# Patient Record
Sex: Female | Born: 1950 | Race: White | Hispanic: No | Marital: Married | Smoking: Former smoker
Health system: Southern US, Community
[De-identification: ages and names within clinical notes are randomized; demographics above are authoritative.]

## PROBLEM LIST (undated history)

## (undated) DIAGNOSIS — I480 Paroxysmal atrial fibrillation: Secondary | ICD-10-CM

## (undated) DIAGNOSIS — J40 Bronchitis, not specified as acute or chronic: Secondary | ICD-10-CM

## (undated) DIAGNOSIS — F1020 Alcohol dependence, uncomplicated: Secondary | ICD-10-CM

## (undated) DIAGNOSIS — J449 Chronic obstructive pulmonary disease, unspecified: Secondary | ICD-10-CM

## (undated) HISTORY — PX: NASAL SINUS SURGERY: SHX719

## (undated) HISTORY — PX: APPENDECTOMY: SHX54

## (undated) HISTORY — PX: ABDOMINAL HYSTERECTOMY: SHX81

## (undated) HISTORY — PX: CHOLECYSTECTOMY: SHX55

---

## 2002-02-25 ENCOUNTER — Encounter: Admission: RE | Admit: 2002-02-25 | Discharge: 2002-02-25 | Payer: Self-pay | Admitting: Family Medicine

## 2002-02-25 ENCOUNTER — Encounter: Payer: Self-pay | Admitting: Family Medicine

## 2002-03-12 ENCOUNTER — Other Ambulatory Visit: Admission: RE | Admit: 2002-03-12 | Discharge: 2002-03-12 | Payer: Self-pay | Admitting: Family Medicine

## 2002-03-14 ENCOUNTER — Encounter: Admission: RE | Admit: 2002-03-14 | Discharge: 2002-03-14 | Payer: Self-pay | Admitting: Family Medicine

## 2002-03-14 ENCOUNTER — Encounter: Payer: Self-pay | Admitting: Family Medicine

## 2002-04-17 ENCOUNTER — Encounter: Admission: RE | Admit: 2002-04-17 | Discharge: 2002-04-17 | Payer: Self-pay

## 2002-05-06 ENCOUNTER — Encounter (INDEPENDENT_AMBULATORY_CARE_PROVIDER_SITE_OTHER): Payer: Self-pay | Admitting: *Deleted

## 2002-05-06 ENCOUNTER — Ambulatory Visit (HOSPITAL_COMMUNITY): Admission: RE | Admit: 2002-05-06 | Discharge: 2002-05-06 | Payer: Self-pay

## 2002-08-09 ENCOUNTER — Inpatient Hospital Stay (HOSPITAL_COMMUNITY): Admission: RE | Admit: 2002-08-09 | Discharge: 2002-08-10 | Payer: Self-pay

## 2002-08-09 ENCOUNTER — Encounter (INDEPENDENT_AMBULATORY_CARE_PROVIDER_SITE_OTHER): Payer: Self-pay

## 2003-04-02 ENCOUNTER — Encounter: Payer: Self-pay | Admitting: Family Medicine

## 2003-04-02 ENCOUNTER — Encounter: Admission: RE | Admit: 2003-04-02 | Discharge: 2003-04-02 | Payer: Self-pay | Admitting: Family Medicine

## 2003-04-16 ENCOUNTER — Encounter (INDEPENDENT_AMBULATORY_CARE_PROVIDER_SITE_OTHER): Payer: Self-pay | Admitting: Specialist

## 2003-04-16 ENCOUNTER — Encounter: Payer: Self-pay | Admitting: General Surgery

## 2003-04-17 ENCOUNTER — Encounter: Payer: Self-pay | Admitting: Gastroenterology

## 2003-04-17 ENCOUNTER — Inpatient Hospital Stay (HOSPITAL_COMMUNITY): Admission: RE | Admit: 2003-04-17 | Discharge: 2003-04-18 | Payer: Self-pay | Admitting: General Surgery

## 2005-01-05 ENCOUNTER — Encounter (INDEPENDENT_AMBULATORY_CARE_PROVIDER_SITE_OTHER): Payer: Self-pay | Admitting: *Deleted

## 2005-01-05 ENCOUNTER — Ambulatory Visit (HOSPITAL_COMMUNITY): Admission: RE | Admit: 2005-01-05 | Discharge: 2005-01-05 | Payer: Self-pay | Admitting: Gastroenterology

## 2005-10-05 ENCOUNTER — Encounter: Admission: RE | Admit: 2005-10-05 | Discharge: 2005-10-05 | Payer: Self-pay | Admitting: Family Medicine

## 2009-08-31 ENCOUNTER — Encounter: Admission: RE | Admit: 2009-08-31 | Discharge: 2009-08-31 | Payer: Self-pay | Admitting: Neurology

## 2010-11-26 NOTE — Op Note (Signed)
   NAMECORTNEY, BEISSEL                          ACCOUNT NO.:  [id number]   MEDICAL RECORD NO.:  [id number]                   PATIENT TYPE:  OBV   LOCATION:  0362                                 FACILITY:  [facility]   PHYSICIAN:  John C. Madilyn Fireman, M.D.                 DATE OF BIRTH:  1950/07/12   DATE OF PROCEDURE:  04/17/2003  DATE OF DISCHARGE:                                 OPERATIVE REPORT   PROCEDURE:  Endoscopic retrograde cholangiopancreatography with  sphincterotomy and stone extraction.   INDICATION FOR PROCEDURE:  Common bile duct stone seen on intraoperative  cholangiogram at the time of cholecystectomy yesterday.   DESCRIPTION OF PROCEDURE:  The patient was placed in the prone position and  placed on the pulse monitor with continuous low-flow oxygen delivered by  nasal cannula.  She was sedated with 100 mcg IV fentanyl and 10 mg IV  Versed.  The Olympus video side-viewing endoscope was advanced blindly into  the oropharynx and esophagus and stomach.  The pylorus was traversed and the  papilla of Vater located on the medial duodenal wall.  It had a normal  appearance.  Cannulation was achieved with Wilson-Cook sphincterotome and  the guidewire advanced into the duct.  A cholangiogram was obtained which  confirmed two irregular filling defects, as seen at time of intraoperative  cholangiogram yesterday.  A large sphincterotomy was performed, and both  stones were removed with the 12 mm ballon catheter, but no other filling  defects were seen.  The scope was then withdrawn and the patient returned to  the recovery room in stable condition.  She tolerated the procedure well,  and there were no immediate complications.   IMPRESSION:  Two common bile duct stones, status post sphincterotomy and  stone extraction.   PLAN:  Advance diet and watch for complications.                                               John C. Madilyn Fireman, M.D.    JCH/MEDQ  D:  04/17/2003  T:  04/17/2003   Job:  [id number]   cc:   Lorne Skeens. Hoxworth, M.D.  [address], [address]  [place]  [place] [address]  Fax: [phone]

## 2010-11-26 NOTE — Discharge Summary (Signed)
   Kathy Frost, Kathy Frost                          ACCOUNT NO.:  [id number]   MEDICAL RECORD NO.:  [id number]                   PATIENT TYPE:  INP   LOCATION:  9134                                 FACILITY:  [facility]   PHYSICIAN:  Ronda Fairly. Galen Daft, M.D.              DATE OF BIRTH:  1951-03-20   DATE OF ADMISSION:  08/09/2002  DATE OF DISCHARGE:  08/10/2002                                 DISCHARGE SUMMARY   ADMISSION DIAGNOSES:  1. Menometorrhagia.  2. Uterine fibroids.   PRINCIPAL DIAGNOSES:  1. Menometorrhagia.  2. Uterine fibroids.   PRINCIPAL PROCEDURE:  Total vaginal hysterectomy and bilateral salpingo-  oophorectomy.   COMPLICATIONS:  None.   CONDITION ON DISCHARGE:  Stable.   HOSPITAL COURSE:  The patient was admitted on 08/09/02, for vaginal  hysterectomy and bilateral salpingo-oophorectomy.  She had abnormal uterine  bleeding and pelvic pain.  This patient is a 60 year old woman.  She had  prior treatment which was unsuccessful.  So she had this procedure without  difficulty.  Dr. Ashley Royalty was the assisting surgeon, and she was doing well  in the postoperative period with the exception of some nausea.  She had a  discontinuation of the morphine which resulted in an excellent response to  her nausea complaint.  On the first postoperative day, she was doing very  well, she was clinically stable, following up with a regular diet, and no  problems.  Her activity limits, wound care, follow up in the office, and  medications were discussed with the patient.  She was discharged home  without difficulty on 08/10/02, able to void well without difficulty.  No  problems with her bowel habits.  Her examination was unremarkable.  No CVA  tenderness or fever.   FINAL DIAGNOSES:  1. Uterine fibroids, symptomatic.  2. Simple hyperplasia without atypia.  The pathology came back with normal cervix, no abnormality.  Both tubes and  ovaries benign.  Paratubal cyst, and benign ovaries without  pathologic  abnormality.                                               Ronda Fairly. Galen Daft, M.D.    NJT/MEDQ  D:  09/09/2002  T:  09/09/2002  Job:  [id number]

## 2010-11-26 NOTE — Op Note (Signed)
NAMEJOSELIN, CRANDELL                          ACCOUNT NO.:  [id number]   MEDICAL RECORD NO.:  [id number]                   PATIENT TYPE:  AMB   LOCATION:  DAY                                  FACILITY:  [facility]   PHYSICIAN:  Sharlet Salina T. Hoxworth, M.D.          DATE OF BIRTH:  March 06, 1951   DATE OF PROCEDURE:  04/16/2003  DATE OF DISCHARGE:                                 OPERATIVE REPORT   PREOPERATIVE DIAGNOSIS:  Cholelithiasis and cholecystitis.   POSTOPERATIVE DIAGNOSIS:  Cholelithiasis and cholecystitis.  Choledocholithiasis.   PROCEDURE:  Laparoscopic cholecystectomy with intraoperative cholangiogram.   SURGEON:  Sharlet Salina T. Hoxworth, M.D.   ANESTHESIA:  General.   BRIEF HISTORY:  Ms. Cowper is a 60 year old white female who presents with a  number of months of worsening episodic right upper quadrant abdominal pain  which now has become almost daily and severe. She has had a gallbladder  ultrasound showing multiple gallstones with a thickened gallbladder wall and  normal common bile duct. LFTs were normal. Laparoscopic cholecystectomy with  cholangiogram has been recommended and accepted. The nature of the  procedure, indications, risks of bleeding, infection, bile leak and bile  duct injury were discussed and understood. She is now brought to the  operating room for this procedure.   DESCRIPTION OF PROCEDURE:  The patient was brought to the operating room,  placed in supine position on the operating table and general endotracheal  anesthesia was induced. She received preoperative antibiotics. PAS were in  place. The abdomen was sterilely prepped and draped. The abdomen was  accessed with an open Hasson technique at the umbilicus and standard 4  trocars used. The gallbladder was visualized and was quite thickened  subacutely and chronically inflamed. Omental adhesions were taken down off  the gallbladder and the fundus was grasped and elevated up over the liver.  The  infundibulum was retracted inferolaterally. The gallbladder was  thickened right down to the cystic duct area. Peritoneum anterior and  posterior to Calot's triangle was incised and fibrofatty tissues carefully  stripped down off the neck of the gallbladder toward the porta hepatis. The  cystic duct was identified which appeared somewhat large. The cystic duct  gallbladder junction was dissected 360 degrees and the cystic artery clearly  identified in Calot's triangle. At this point, the cystic duct was clipped  at the gallbladder junction, operative cholangiograms obtained through the  cystic duct. This showed a normal caliber common bile duct and intrahepatic  ducts with flow into the duodenum but two definite mobile filling defects  consistent with stones in the distal common bile duct. I felt that there was  really too much inflammatory change around the cystic duct to attempt  laparoscopic stone extraction. I elected to leave these for ERCP and  extraction. The cholangiocath was removed and the cystic duct was tied with  a #0 Vicryl endoloop and additionally clipped. The cystic artery, anterior  and posterior branches were divided between the proximal and distal clips.  The gallbladder was removed from the bed with cautery and removed through  the umbilicus. The right upper quadrant was thoroughly irrigated and  hemostasis ensured. The trocars were removed and all CO2 evacuated. The  mattress suture of Vicryl was secured at the  umbilicus. The skin incisions were closed with subcuticular 4-0 Monocryl and  Steri-Strips. Sponge, needle and instrument counts were correct. Dry sterile  dressings were applied and the patient taken to recovery in good condition.                                               Lorne Skeens. Hoxworth, M.D.    Tory Emerald  D:  04/16/2003  T:  04/16/2003  Job:  [id number]

## 2010-11-26 NOTE — Op Note (Signed)
Kathy Frost, Kathy Frost                          ACCOUNT NO.:  [id number]   MEDICAL RECORD NO.:  [id number]                   PATIENT TYPE:  INP   LOCATION:  9324                                 FACILITY:  [facility]   PHYSICIAN:  Ronda Fairly. Galen Daft, M.D.              DATE OF BIRTH:  [birth year]   DATE OF PROCEDURE:  08/09/2002  DATE OF DISCHARGE:                                 OPERATIVE REPORT   PREOPERATIVE DIAGNOSES:  1. Menometrorrhagia.  2. Uterine fibroids.  3. Complex hyperplasia on D&C specimen.   POSTOPERATIVE DIAGNOSES:  1. Menometrorrhagia.  2. Uterine fibroids.  3. Complex hyperplasia on D&C specimen.   PROCEDURE:  1. Total vaginal hysterectomy.  2. Bilateral salpingo-oophorectomy.   SURGEON:  Ronda Fairly. Galen Daft, M.D.   ASSISTANT:  Rudy Jew. Ashley Royalty, M.D.   COMPLICATIONS:  None.   ESTIMATED BLOOD LOSS:  300 mL.   SPECIMENS:  Uterus, cervix, tubes, and ovaries bilaterally and uterine  fibroids.   ANESTHESIA:  General.   OPERATIVE COURSE:  The patient was identified as Kathy Frost during time  out.  Prior to this we did informed consent in the holding area including  the risks of surgery, hemorrhage, infection, injury to internal organs, and  recovery and pain.  The patient wished to have BSO if the ovaries were  accessible.  The patient had reviewed the consent with me, had signed it,  and was brought to the operating room.  Again, time out was performed.  Betadine prep sterile technique and general anesthesia.  Bladder was  catheterized at the initial part of the procedure in-and-out  catheterization.  The cervix was infiltrated with lidocaine 1% with  1:100,000 epinephrine for a medical tourniquet.  This was a total of 18 mL  injected.  Care was taken to avoid intravascular injection.  The posterior  cul-de-sac was identified and the area was free of adhesions.  This was  entered without difficulty using Mayo scissors.  The retractors were placed  in the  appropriate locations.  The uterosacral ligaments were clamped,  divided, and ligated with 0 Vicryl suture.  The anterior cul-de-sac was  entered and care was taken to avoid injury to the bladder and adjacent  structures.  The uterosacral ligaments having been previously captured.  Now  the cardinal ligaments including the uterine artery were captured on either  side with 0 Vicryl suture ligature.  Successive bites of the cardinal  ligament and finally the utero-ovarian ligament were carried out without  difficulty.  One uterine fibroid separated free from the uterus during the  extraction.  This was sent together with the specimen.  The ovaries appeared  normal; however, they were readily accessible and as I had discussed with  the patient prior we decided to remove the ovaries with the tubes.  The  utero-ovarian ligaments had been grasped and these then were grasped at the  infundibulopelvic ligament and  the ovaries and the tubes were removed on  each side.  The left side was removed with ovary and tube.  The right side  was removed with tube separate from ovary.  The right side was completely  hemostatic.  On the left side there was a blood vessel which was bleeding.  This was grasped with a right angle clamp and identified separate and  distinct from vital structures and ligated with 2-0 Vicryl.  This led to  complete hemostasis.  During the capture of this blood vessel there was  approximately 150 mL of additional blood loss.  The total estimated blood  loss was 300 mL for the case.  The patient remained stable throughout the  case.  The areas were inspected for hemostasis.  Everything was completely  hemostatic.  All instrument, sponge, and needle counts were correct and the  vagina was closed in a running locking fashion using the uterosacral  ligament complex for reinforcement and for support of the vaginal wall.  The  patient left the operating room in stable condition.  All  instrument,  sponge, and needle counts were correct at the end of the case.  Total  estimated blood loss was 300 mL.                                               Ronda Fairly. Galen Daft, M.D.    NJT/MEDQ  D:  08/09/2002  T:  08/09/2002  Job:  [id number]   cc:   Donia Guiles, M.D.  [address] [place]  [place] [address]  Fax: [phone]

## 2010-11-26 NOTE — Op Note (Signed)
   Kathy Frost, Kathy Frost                          ACCOUNT NO.:  [id number]   MEDICAL RECORD NO.:  [id number]                   PATIENT TYPE:  AMB   LOCATION:  SDC                                  FACILITY:  [facility]   PHYSICIAN:  Ronda Fairly. Galen Daft, M.D.              DATE OF BIRTH:  [birth year]   DATE OF PROCEDURE:  05/06/2002  DATE OF DISCHARGE:                                 OPERATIVE REPORT   PREOPERATIVE DIAGNOSIS:  Menometrorrhagia and uterine fibroids.   POSTOPERATIVE DIAGNOSES:  1. Menometorrhagia and uterine fibroids.  2. Endometrial polyps.   PROCEDURE:  Hysteroscopy with resection of endometrial polyps and curettage.   SURGEON:  Ronda Fairly. Galen Daft, M.D.   COMPLICATIONS:  None.   CONDITION:  Stable.   SPECIMENS:  Uterine polyps and uterine curettage.   ESTIMATED BLOOD LOSS:  10 cc or less.   ANESTHESIA:  Monitored anesthesia care with local 13 cc of 1% lidocaine.   DESCRIPTION OF PROCEDURE:  The patient was identified.  Informed consent was  obtained.  Time out was performed, and the bladder was catheterized.  The  bladder was catheterized and emptied.  Betadine prep sterile technique.  The  uterus was slightly enlarged by examination, retroverted and difficult to  palpate its full extent.  No adnexal masses.  The cervix was dilated to  accept a hysteroscope.  Placing the sound in prior to this revealed the  uterine cavity to be 9 cm in length.  The hysteroscope revealed evidence of  uterine polyps in the endometrial canal.  Polyp forceps were utilized and we  were able to remove all of the polyps.  These were sent separately from the  uterine curettage.  There was, in addition, some fluffy endometrium, which  was removed with sharp curet.  There were no complications.  The total  procedure was approximately 100 cc fluid deficit without difficulty.  The  patient left the operating room in stable condition.  All instrument, sponge  and needle counts were correct.                                           Ronda Fairly. Galen Daft, M.D.    NJT/MEDQ  D:  05/06/2002  T:  05/06/2002  Job:  [id number]

## 2010-11-26 NOTE — Consult Note (Signed)
Kathy Frost, Kathy Frost                          ACCOUNT NO.:  [id number]   MEDICAL RECORD NO.:  [id number]                   PATIENT TYPE:  OBV   LOCATION:  0362                                 FACILITY:  [facility]   PHYSICIAN:  Kathy Frost, M.D.                DATE OF BIRTH:  03-Apr-1951   DATE OF CONSULTATION:  04/16/2003  DATE OF DISCHARGE:                                   CONSULTATION   REASON FOR ADMISSION:  Dr. Johna Frost asked [place] to see this 60 year old female  because of apparent common duct stones on intraoperative cholangiography  performed today.   HISTORY:  Kathy Frost underwent a laparoscopic cholecystectomy today for a  history of cholecystitis with a severely inflamed gallbladder found at  surgery, and cholangiography showed two 3-4 mm filling defects, which were  persistent and appear real even though there was no significant ductal  dilatation, and there was adequate flow of contrast into the duodenum.  Preoperative liver chemistries and white count were completely normal.   Based on these findings, however, GI evaluation was felt to be warranted.   The patient is known to my partner, Dr. Vida Frost, who was going to perform  screening colonoscopy on the patient some time ago, but due to an unexpected  work conflict, she had to cancel that exam, and has never followed through  with it.  I advised her to contact Dr. Ewing Frost at her convenience to have this  attended to.   PAST MEDICAL HISTORY:   ALLERGIES:  CODEINE (GI upset.   OUTPATIENT MEDICATIONS:  None.   OPERATIONS:  1. Appendectomy as a child.  2. Hysterectomy in January of 2004.  3. Laparoscopic cholecystectomy today.   MEDICAL ILLNESSES:  No cardiopulmonary disease, diabetes, or hypertension.   HABITS:  Occasional ethanol.  One pack per day smoking history.   FAMILY HISTORY:  Negative for GI tract illnesses including gallbladder  disease, colon cancer, liver disease, and ulcers.   SOCIAL HISTORY:   Married.  Accompanied by her husband at the hospital room.   REVIEW OF SYSTEMS:  Not obtained.   PHYSICAL EXAMINATION:  GENERAL:  A pleasant, healthy-appearing female in no  evident distress.  Anicteric.  CHEST:  Clear.  HEART:  Normal.  ABDOMEN:  At this time, the abdomen is remarkably nontender.  She has  bandages over her laparoscopic incisions.   LABORATORY DATA:  Liver chemistries normal.  White count normal.  Hemoglobin  15.6.   Intraoperative cholangiogram reviewed.  Agree with Kathy Frost  interpretation that there are two probable stones present.   DISCUSSION AND PLAN:  Most of this evening's time with the patient and her  husband was spent going over principles of choledocholithiasis, the nature  history of that condition, and options for management in this setting, which  would include expectant observation, MRCP to try to verify the presence of  the stones, or proceeding  directly to ERCP with probable sphincterotomy.  The risks of ERCP, sphincterotomy, and stone extraction were discussed, and  included pancreatitis, roughly 5%, with occasional mortality, and serious  complications, perhaps 1% of the time.  The patient seems to understand well  and is agreeable to proceed.   The patient is aware that due to scheduling conflicts, I will not be  available for her exam tomorrow, and that it will probably be my partner,  Kathy Frost, performing the exam, and she is agreeable to this.                                               Kathy Frost, M.D.    RB/MEDQ  D:  04/16/2003  T:  04/16/2003  Job:  [id number]   cc:   Kathy Frost. Hoxworth, M.D.  [address], [address]  [place]  [place] [address]  Fax: [phone]   Izora Ribas(?) Hetty Ely(?), M.D.

## 2010-11-26 NOTE — Op Note (Signed)
NAMELATALIA, ETZLER NO.:  [id number]   MEDICAL RECORD NO.:  [id number]          PATIENT TYPE:  AMB   LOCATION:  ENDO                         [facility]ACILITY[facility]   PHYSICIAN:  Petra Kuba, M.D.    DATE OF BIRTH:  25-May-1951   DATE OF PROCEDURE:  01/05/2005  DATE OF DISCHARGE:                                 OPERATIVE REPORT   PROCEDURE:  Colonoscopy with polypectomy.   INDICATION:  A family history of colon polyps, due for colonic screening.  Consent was signed after risks, benefits, methods, options were thoroughly  discussed in the office.   MEDICATIONS USED:  Demerol 70, Versed 6.   DESCRIPTION OF PROCEDURE:  Rectal inspection was pertinent for external  hemorrhoids.  Small digital exam was negative.  The video pediatric  adjustable colonoscope was inserted with some difficulty due to a tortuous  sigmoid which required abdominal pressure.  Once through this area, I was  fairly easily able to advance around the colon to the cecum, which was  identified by the appendiceal orifice and ileocecal valve.  No abnormalities  were seen on insertion.  No position changes were needed.  The cecum was  identified by the appendiceal orifice and ileocecal valve.  The scope was  slowly withdrawn.  The prep was adequate.  There was some liquid stool that  required washing and suctioning.  The cecum, ascending, transverse,  descending, and majority of the sigmoid were normal.  In the distal sigmoid,  two small sessile polyps were seen.  Both were snared and electrocautery  applied, suctioned through the scope, and collected in the trap.  There were  about six tiny hyperplastic appearing polyps in both the distal sigmoid and  rectum which were hot biopsied as well and put in the same container.  Anal  rectal pull-through and retroflexion confirmed some small hemorrhoids.  The  scope was straightened and re-advanced a short ways up the left side of the  colon.  Air was  suctioned and the scope removed.  The patient tolerated the  procedure well.  There was no obvious immediate complication.   ENDOSCOPIC DIAGNOSES:  1.  Internal/external hemorrhoids.  2.  Tortuous sigmoid.  3.  Multiple tiny hyperplastic-appearing rectal distal sigmoid polyps, hot      biopsied.  4.  Two small sessile polyps snared in the distal sigmoid.  5.  Otherwise within normal limits to the cecum.   PLAN:  Await pathology to determine future colonic screening.  Happy to see  back p.r.n.  Otherwise return to care of Dr. Arvilla Market for the customary  healthcare maintenance to include yearly rectals and guaiacs.       MEM/MEDQ  D:  01/05/2005  T:  01/05/2005  Job:  [id number]   cc:   Donia Guiles, M.D.  [address] [place]  [place] [address]  Fax: [phone]

## 2012-12-03 ENCOUNTER — Ambulatory Visit: Payer: BC Managed Care – PPO

## 2012-12-03 ENCOUNTER — Ambulatory Visit (INDEPENDENT_AMBULATORY_CARE_PROVIDER_SITE_OTHER): Payer: BC Managed Care – PPO | Admitting: Emergency Medicine

## 2012-12-03 VITALS — BP 138/90 | HR 92 | Temp 98.1°F | Resp 16 | Ht 67.0 in | Wt 148.0 lb

## 2012-12-03 DIAGNOSIS — M79642 Pain in left hand: Secondary | ICD-10-CM

## 2012-12-03 DIAGNOSIS — M79609 Pain in unspecified limb: Secondary | ICD-10-CM

## 2012-12-03 DIAGNOSIS — S62309A Unspecified fracture of unspecified metacarpal bone, initial encounter for closed fracture: Secondary | ICD-10-CM

## 2012-12-03 NOTE — Progress Notes (Signed)
   Patient ID: MELEA PREZIOSO MRN: [id number], DOB: 09-18-1950, 62 y.o. Date of Encounter: 12/03/2012, 11:34 AM   PROCEDURE NOTE: Verbal consent obtained.  Ulnar gutter splint applied to the left hand. Wrapped with ACE wrap per usual protocol.  Cap refill less than 2 seconds through all digits. Patient tolerated well.   Signed, Eula Listen, PA-C 12/03/2012 11:34 AM

## 2012-12-03 NOTE — Patient Instructions (Addendum)
Hand Fracture Your caregiver has diagnosed you with a fractured (broken) bone in your hand. If the bones are in good position and the hand is properly immobilized and rested, these injuries will usually heal in 3 to 6 weeks. A cast, splint, or bulky bandage is usually applied to keep the fracture site from moving. Do not remove the splint or cast until your caregiver approves. If the fracture is unstable or the bones are not aligned properly, surgery may be needed. Keep your hand raised (elevated) above the level of your heart as much as possible for the next 2 to 3 days until the swelling and pain are better. Apply ice packs for 15-20 minutes every 3 to 4 hours to help control the pain and swelling. See your caregiver or an orthopedic specialist as directed for follow-up care to make sure the fracture is beginning to heal properly. SEEK IMMEDIATE MEDICAL CARE IF:   You notice your fingers are cold, numb, crooked, or the pain of your injury is severe.  You are not improving or seem to be getting worse.  You have questions or concerns. Document Released: 08/04/2004 Document Revised: 09/19/2011 Document Reviewed: 12/23/2008 Exit[employer] Patient Information 2014 [employer].  

## 2012-12-03 NOTE — Progress Notes (Signed)
[facility] [address] [place] [address] [phone]  Date:  12/03/2012   Name:  Kathy Frost   DOB:  September 03, 1950   MRN:  [id number]  PCP:  No primary provider on file.    Chief Complaint: Hand Pain   History of Present Illness:  Kathy Frost is a 62 y.o. very pleasant female patient who presents with the following:  Missed a step on a ladder and fell to the ground, injuring her left hand on Saturday.  Has increased pain and swelling and no deformity.  Pain increases with use.  No improvement with over the counter medications or other home remedies. Denies other complaint of injury or health concern today.   There are no active problems to display for this patient.   History reviewed. No pertinent past medical history.  History reviewed. No pertinent past surgical history.  History  Substance Use Topics  . Smoking status: Current Every Day Smoker  . Smokeless tobacco: Not on file  . Alcohol Use: Yes    History reviewed. No pertinent family history.  No Known Allergies  Medication list has been reviewed and updated.  No current outpatient prescriptions on file prior to visit.   No current facility-administered medications on file prior to visit.    Review of Systems:  As per HPI, otherwise negative.    Physical Examination: Filed Vitals:   12/03/12 1015  BP: 138/90  Pulse: 92  Temp: 98.1 F (36.7 C)  Resp: 16   Filed Vitals:   12/03/12 1015  Height: 5\' 7"  (1.702 m)  Weight: 148 lb (67.132 kg)   Body mass index is 23.17 kg/(m^2). Ideal Body Weight: Weight in (lb) to have BMI = 25: 159.3   GEN: WDWN, NAD, Non-toxic, Alert & Oriented x 3 HEENT: Atraumatic, Normocephalic.  Ears and Nose: No external deformity. EXTR: No clubbing/cyanosis/edema NEURO: Normal gait.  PSYCH: Normally interactive. Conversant. Not depressed or anxious appearing.  Calm demeanor.  Left Hand:  Tender and ecchymotic left hand over 4-5  metatarsals.  No deformity.  Moderate edema.   Assessment and Plan: Fracture fifth metacarpal Ulnar gutter splint Ortho referral   Signed,  Phillips Odor, MD

## 2012-12-04 ENCOUNTER — Telehealth: Payer: Self-pay

## 2012-12-04 NOTE — Telephone Encounter (Signed)
Calling about referral to orthopaedic surgeon. States she has left several messages with [place] about this. [phone]

## 2012-12-05 NOTE — Telephone Encounter (Signed)
Patient needs x-ray for an appointment with [facility] at 8 am.    [phone]

## 2013-04-21 ENCOUNTER — Inpatient Hospital Stay (HOSPITAL_COMMUNITY)
Admission: EM | Admit: 2013-04-21 | Discharge: 2013-04-24 | DRG: 087 | Disposition: A | Discharge status: Home / Self Care | Payer: BC Managed Care – PPO | Attending: Internal Medicine | Admitting: Internal Medicine

## 2013-04-21 ENCOUNTER — Encounter (HOSPITAL_COMMUNITY): Payer: Self-pay | Admitting: Emergency Medicine

## 2013-04-21 ENCOUNTER — Emergency Department (HOSPITAL_COMMUNITY): Payer: BC Managed Care – PPO

## 2013-04-21 DIAGNOSIS — Z801 Family history of malignant neoplasm of trachea, bronchus and lung: Secondary | ICD-10-CM

## 2013-04-21 DIAGNOSIS — J440 Chronic obstructive pulmonary disease with acute lower respiratory infection: Secondary | ICD-10-CM | POA: Diagnosis present

## 2013-04-21 DIAGNOSIS — J9601 Acute respiratory failure with hypoxia: Secondary | ICD-10-CM | POA: Diagnosis present

## 2013-04-21 DIAGNOSIS — F17200 Nicotine dependence, unspecified, uncomplicated: Secondary | ICD-10-CM | POA: Diagnosis present

## 2013-04-21 DIAGNOSIS — R21 Rash and other nonspecific skin eruption: Secondary | ICD-10-CM | POA: Diagnosis present

## 2013-04-21 DIAGNOSIS — J209 Acute bronchitis, unspecified: Secondary | ICD-10-CM | POA: Diagnosis present

## 2013-04-21 DIAGNOSIS — J9600 Acute respiratory failure, unspecified whether with hypoxia or hypercapnia: Principal | ICD-10-CM

## 2013-04-21 DIAGNOSIS — R7989 Other specified abnormal findings of blood chemistry: Secondary | ICD-10-CM

## 2013-04-21 DIAGNOSIS — Z720 Tobacco use: Secondary | ICD-10-CM

## 2013-04-21 DIAGNOSIS — Z9089 Acquired absence of other organs: Secondary | ICD-10-CM

## 2013-04-21 DIAGNOSIS — J449 Chronic obstructive pulmonary disease, unspecified: Secondary | ICD-10-CM

## 2013-04-21 DIAGNOSIS — Z79899 Other long term (current) drug therapy: Secondary | ICD-10-CM

## 2013-04-21 DIAGNOSIS — R0902 Hypoxemia: Secondary | ICD-10-CM

## 2013-04-21 DIAGNOSIS — IMO0002 Reserved for concepts with insufficient information to code with codable children: Secondary | ICD-10-CM

## 2013-04-21 DIAGNOSIS — Z23 Encounter for immunization: Secondary | ICD-10-CM

## 2013-04-21 DIAGNOSIS — F1010 Alcohol abuse, uncomplicated: Secondary | ICD-10-CM | POA: Diagnosis present

## 2013-04-21 HISTORY — DX: Bronchitis, not specified as acute or chronic: J40

## 2013-04-21 HISTORY — DX: Chronic obstructive pulmonary disease, unspecified: J44.9

## 2013-04-21 LAB — BASIC METABOLIC PANEL
Calcium: 9.8 mg/dL (ref 8.4–10.5)
Creatinine, Ser: 0.6 mg/dL (ref 0.50–1.10)
Glucose, Bld: 116 mg/dL — ABNORMAL HIGH (ref 70–99)
Potassium: 4.4 mEq/L (ref 3.5–5.1)

## 2013-04-21 LAB — CBC
MCH: 33.1 pg (ref 26.0–34.0)
MCHC: 35.7 g/dL (ref 30.0–36.0)
MCV: 92.9 fL (ref 78.0–100.0)
Platelets: 482 10*3/uL — ABNORMAL HIGH (ref 150–400)
WBC: 12 10*3/uL — ABNORMAL HIGH (ref 4.0–10.5)

## 2013-04-21 LAB — POCT I-STAT 3, ART BLOOD GAS (G3+)
Acid-base deficit: 1 mmol/L (ref 0.0–2.0)
Bicarbonate: 20.2 mEq/L (ref 20.0–24.0)
O2 Saturation: 98 %
TCO2: 21 mmol/L (ref 0–100)
pH, Arterial: 7.496 — ABNORMAL HIGH (ref 7.350–7.450)
pO2, Arterial: 89 mmHg (ref 80.0–100.0)

## 2013-04-21 LAB — POCT I-STAT TROPONIN I: Troponin i, poc: 0 ng/mL (ref 0.00–0.08)

## 2013-04-21 LAB — PRO B NATRIURETIC PEPTIDE: Pro B Natriuretic peptide (BNP): 375.9 pg/mL — ABNORMAL HIGH (ref 0–125)

## 2013-04-21 LAB — D-DIMER, QUANTITATIVE: D-Dimer, Quant: <0.27 ug/mL-FEU (ref 0.00–0.48)

## 2013-04-21 MED ORDER — PANTOPRAZOLE SODIUM 40 MG PO TBEC
40.0000 mg | DELAYED_RELEASE_TABLET | Freq: Every day | ORAL | Status: DC
  Administered 2013-04-22 – 2013-04-24 (×3): 40 mg via ORAL
  Filled 2013-04-21 (×2): qty 1

## 2013-04-21 MED ORDER — BUDESONIDE 0.25 MG/2ML IN SUSP
0.2500 mg | Freq: Two times a day (BID) | RESPIRATORY_TRACT | Status: DC
  Administered 2013-04-21 – 2013-04-23 (×5): 0.25 mg via RESPIRATORY_TRACT
  Filled 2013-04-21 (×8): qty 2

## 2013-04-21 MED ORDER — ACETAMINOPHEN 650 MG RE SUPP: 650.0000 mg | Freq: Four times a day (QID) | RECTAL | Status: DC | PRN

## 2013-04-21 MED ORDER — ONDANSETRON HCL 4 MG PO TABS: 4.0000 mg | ORAL_TABLET | Freq: Four times a day (QID) | ORAL | Status: DC | PRN

## 2013-04-21 MED ORDER — ALBUTEROL SULFATE (5 MG/ML) 0.5% IN NEBU
2.5000 mg | INHALATION_SOLUTION | Freq: Four times a day (QID) | RESPIRATORY_TRACT | Status: DC
  Administered 2013-04-21 – 2013-04-22 (×2): 2.5 mg via RESPIRATORY_TRACT
  Filled 2013-04-21 (×2): qty 0.5

## 2013-04-21 MED ORDER — ACETAMINOPHEN 325 MG PO TABS: 650.0000 mg | ORAL_TABLET | Freq: Four times a day (QID) | ORAL | Status: DC | PRN

## 2013-04-21 MED ORDER — ENOXAPARIN SODIUM 40 MG/0.4ML ~~LOC~~ SOLN
40.0000 mg | SUBCUTANEOUS | Status: DC
  Administered 2013-04-21 – 2013-04-23 (×3): 40 mg via SUBCUTANEOUS
  Filled 2013-04-21 (×4): qty 0.4

## 2013-04-21 MED ORDER — ALBUTEROL SULFATE (5 MG/ML) 0.5% IN NEBU
2.5000 mg | INHALATION_SOLUTION | RESPIRATORY_TRACT | Status: DC
  Administered 2013-04-21: 2.5 mg via RESPIRATORY_TRACT
  Filled 2013-04-21: qty 0.5

## 2013-04-21 MED ORDER — PREDNISONE 20 MG PO TABS
60.0000 mg | ORAL_TABLET | Freq: Once | ORAL | Status: AC
  Administered 2013-04-21: 60 mg via ORAL
  Filled 2013-04-21: qty 3

## 2013-04-21 MED ORDER — ONDANSETRON HCL 4 MG/2ML IJ SOLN: 4.0000 mg | Freq: Four times a day (QID) | INTRAMUSCULAR | Status: DC | PRN

## 2013-04-21 MED ORDER — ADULT MULTIVITAMIN W/MINERALS CH
1.0000 | ORAL_TABLET | Freq: Every day | ORAL | Status: DC
  Administered 2013-04-21 – 2013-04-24 (×4): 1 via ORAL
  Filled 2013-04-21 (×4): qty 1

## 2013-04-21 MED ORDER — VITAMIN B-1 100 MG PO TABS
100.0000 mg | ORAL_TABLET | Freq: Every day | ORAL | Status: DC
  Administered 2013-04-21 – 2013-04-24 (×4): 100 mg via ORAL
  Filled 2013-04-21 (×4): qty 1

## 2013-04-21 MED ORDER — SODIUM CHLORIDE 0.9 % IJ SOLN
3.0000 mL | Freq: Two times a day (BID) | INTRAMUSCULAR | Status: DC
  Administered 2013-04-21 – 2013-04-23 (×5): 3 mL via INTRAVENOUS

## 2013-04-21 MED ORDER — PNEUMOCOCCAL VAC POLYVALENT 25 MCG/0.5ML IJ INJ
0.5000 mL | INJECTION | INTRAMUSCULAR | Status: AC
  Administered 2013-04-22: 0.5 mL via INTRAMUSCULAR
  Filled 2013-04-21: qty 0.5

## 2013-04-21 MED ORDER — IPRATROPIUM BROMIDE 0.02 % IN SOLN: 0.5000 mg | RESPIRATORY_TRACT | Status: DC | PRN

## 2013-04-21 MED ORDER — LORAZEPAM 2 MG/ML IJ SOLN: 1.0000 mg | Freq: Four times a day (QID) | INTRAMUSCULAR | Status: DC | PRN

## 2013-04-21 MED ORDER — ONDANSETRON HCL 4 MG/2ML IJ SOLN: 4.0000 mg | Freq: Three times a day (TID) | INTRAMUSCULAR | Status: DC | PRN

## 2013-04-21 MED ORDER — ALBUTEROL (5 MG/ML) CONTINUOUS INHALATION SOLN
10.0000 mg/h | INHALATION_SOLUTION | Freq: Once | RESPIRATORY_TRACT | Status: AC
  Administered 2013-04-21: 10 mg/h via RESPIRATORY_TRACT

## 2013-04-21 MED ORDER — METHYLPREDNISOLONE SODIUM SUCC 40 MG IJ SOLR
40.0000 mg | Freq: Two times a day (BID) | INTRAMUSCULAR | Status: DC
  Administered 2013-04-21 – 2013-04-23 (×4): 40 mg via INTRAVENOUS
  Filled 2013-04-21 (×5): qty 1

## 2013-04-21 MED ORDER — LORAZEPAM 2 MG/ML IJ SOLN
0.0000 mg | Freq: Four times a day (QID) | INTRAMUSCULAR | Status: DC
  Administered 2013-04-22 (×2): 2 mg via INTRAVENOUS
  Filled 2013-04-21 (×2): qty 1

## 2013-04-21 MED ORDER — LORAZEPAM 1 MG PO TABS: 1.0000 mg | ORAL_TABLET | Freq: Four times a day (QID) | ORAL | Status: DC | PRN

## 2013-04-21 MED ORDER — ALBUTEROL SULFATE (5 MG/ML) 0.5% IN NEBU: 2.5000 mg | INHALATION_SOLUTION | RESPIRATORY_TRACT | Status: DC | PRN

## 2013-04-21 MED ORDER — INFLUENZA VAC SPLIT QUAD 0.5 ML IM SUSP
0.5000 mL | INTRAMUSCULAR | Status: AC
  Administered 2013-04-22: 0.5 mL via INTRAMUSCULAR
  Filled 2013-04-21: qty 0.5

## 2013-04-21 MED ORDER — LORAZEPAM 2 MG/ML IJ SOLN: 0.0000 mg | Freq: Two times a day (BID) | INTRAMUSCULAR | Status: DC

## 2013-04-21 MED ORDER — IPRATROPIUM BROMIDE 0.02 % IN SOLN
0.5000 mg | Freq: Four times a day (QID) | RESPIRATORY_TRACT | Status: DC
  Administered 2013-04-21 – 2013-04-22 (×2): 0.5 mg via RESPIRATORY_TRACT
  Filled 2013-04-21 (×2): qty 2.5

## 2013-04-21 MED ORDER — IPRATROPIUM BROMIDE 0.02 % IN SOLN: 0.5000 mg | RESPIRATORY_TRACT | Status: DC

## 2013-04-21 MED ORDER — SODIUM CHLORIDE 0.9 % IJ SOLN
3.0000 mL | Freq: Two times a day (BID) | INTRAMUSCULAR | Status: DC
  Administered 2013-04-22 – 2013-04-23 (×2): 3 mL via INTRAVENOUS

## 2013-04-21 MED ORDER — FOLIC ACID 1 MG PO TABS
1.0000 mg | ORAL_TABLET | Freq: Every day | ORAL | Status: DC
  Administered 2013-04-21 – 2013-04-24 (×4): 1 mg via ORAL
  Filled 2013-04-21 (×4): qty 1

## 2013-04-21 MED ORDER — LEVOFLOXACIN IN D5W 750 MG/150ML IV SOLN
750.0000 mg | INTRAVENOUS | Status: DC
  Administered 2013-04-21 – 2013-04-22 (×2): 750 mg via INTRAVENOUS
  Filled 2013-04-21 (×3): qty 150

## 2013-04-21 MED ORDER — ASPIRIN 81 MG PO CHEW
324.0000 mg | CHEWABLE_TABLET | Freq: Once | ORAL | Status: AC
  Administered 2013-04-21: 324 mg via ORAL
  Filled 2013-04-21: qty 4

## 2013-04-21 MED ORDER — THIAMINE HCL 100 MG/ML IJ SOLN
100.0000 mg | Freq: Every day | INTRAMUSCULAR | Status: DC
  Filled 2013-04-21 (×3): qty 1

## 2013-04-21 MED ORDER — ALBUTEROL SULFATE (5 MG/ML) 0.5% IN NEBU: 2.5000 mg | INHALATION_SOLUTION | RESPIRATORY_TRACT | Status: DC

## 2013-04-21 NOTE — ED Notes (Signed)
Patient transported to X-ray 

## 2013-04-21 NOTE — ED Notes (Signed)
Pt reports recent bronchitis and now has sob and chest heaviness, feels like someone is sitting on her chest. spo2 92% at triage, ekg being done. Having non productive cough.

## 2013-04-21 NOTE — ED Provider Notes (Signed)
TIME SEEN: 4:56 PM  CHIEF COMPLAINT: Shortness of breath, chest tightness  HPI: Patient is a 62 -year-old female with a history of tobacco use who presents the emergency department with complaints of shortness of breath for the past week and chest tightness that started yesterday. She states her symptoms are worse with exertion. She does not wear oxygen at home. She has had a dry cough. No fever. No lower extremity swelling or pain. No history of PE or DVT. No recent prolonged immobilization, international flight, exogenous hormone use, surgery, trauma or fracture. She states a week and a half ago she finished a course of azithromycin for bronchitis. She denies a history of COPD or asthma. She denies a history of hypertension, diabetes, hyperlipidemia or family history of premature CAD. Denies a prior history of stress test or cardiac catheterization. Her PCP is at [facility].  ROS: See HPI Constitutional: no fever  Eyes: no drainage  ENT: no runny nose   Cardiovascular:   chest pain  Resp:  SOB  GI: no vomiting GU: no dysuria Integumentary: no rash  Allergy: no hives  Musculoskeletal: no leg swelling  Neurological: no slurred speech ROS otherwise negative  PAST MEDICAL HISTORY/PAST SURGICAL HISTORY:  Past Medical History  Diagnosis Date  . Bronchitis     MEDICATIONS:  Prior to Admission medications   Not on File    ALLERGIES:  No Known Allergies  SOCIAL HISTORY:  History  Substance Use Topics  . Smoking status: Current Every Day Smoker  . Smokeless tobacco: Not on file  . Alcohol Use: Yes    FAMILY HISTORY: History reviewed. No pertinent family history.  EXAM: BP 119/66  Pulse 88  Temp(Src) 97.8 F (36.6 C) (Oral)  Resp 22  Ht 5\' 7"  (1.702 m)  Wt 145 lb (65.772 kg)  BMI 22.71 kg/m2  SpO2 93% CONSTITUTIONAL: Alert and oriented and responds appropriately to questions. Well-appearing; well-nourished HEAD: Normocephalic EYES: Conjunctivae clear,  PERRL ENT: normal nose; no rhinorrhea; moist mucous membranes; pharynx without lesions noted NECK: Supple, no meningismus, no LAD  CARD: RRR; S1 and S2 appreciated; no murmurs, no clicks, no rubs, no gallops RESP: Oxygen saturation in the low 90s at rest, patient is mildly tachypneic, expiratory wheezing diffusely, diminished at bilateral bases ABD/GI: Normal bowel sounds; non-distended; soft, non-tender, no rebound, no guarding BACK:  The back appears normal and is non-tender to palpation, there is no CVA tenderness EXT: Normal ROM in all joints; non-tender to palpation; no edema; normal capillary refill; no cyanosis    SKIN: Normal color for age and race; warm NEURO: Moves all extremities equally PSYCH: The patient's mood and manner are appropriate. Grooming and personal hygiene are appropriate.  MEDICAL DECISION MAKING: Patient is mildly hypoxic and tachypneic with expiratory wheezing and diminished breath sounds. Suspect possible reactive airway, viral illness, COPD changes. Her troponin is negative. Given she has had constant symptoms since last night, do not feel she needs serial enzymes. Chest x-ray shows no obvious infiltrate. Will treat with steroids and continuous albuterol and reassess. Patient may need admission given her mild hypoxia. Pulmonary embolus is also in the differential. Will obtain d-dimer as I feel patient is low risk.   Date: 04/21/2013 15:02  Rate: 90  Rhythm: normal sinus rhythm  QRS Axis: normal  Intervals: normal  ST/T Wave abnormalities: normal  Conduction Disutrbances: none  Narrative Interpretation: RSR', no ST changes, poor R-wave progression       ED PROGRESS: Patient reports feeling much better  after continuous albuterol. Oxygen saturation is now 96% on room air. She still has diffuse wheezing that has increased compared to prior but is moving much better air. Will give another two duonebs and reassess. D-dimer is pending.   7:34 PM  Pt is still  satting 86% on room air. Given her new oxygen requirement, will admit for likely new diagnosis of COPD. Will continue duo nebs. Oxygen saturation improved to 95% on 4 L nasal cannula. Patient is comfortable with this plan. D-dimer is negative.  Layla Maw Jameca Chumley, DO 04/21/13 1936

## 2013-04-21 NOTE — H&P (Signed)
[facility] History and Physical  Kathy Frost EAV:[id number] DOB: 06-01-1951 DOA: 04/21/2013  Referring physician: ER physician. PCP: No PCP Per [facility].  Chief Complaint: Shortness of breath.  HPI: Kathy Frost is a 62 y.o. female with history of tobacco abuse presents with complaints of worsening shortness of breath over the last 2 weeks. Patient has been short of breath even at rest and is associated wheezing. In the ER patient had required 4 L of oxygen to maintain saturation. Patient denies any chest pain. Has been having subjective feeling of fever and chills with productive cough. Denies any nausea vomiting abdominal pain or diarrhea. Patient has been a minute for acute hypoxic respiratory failure probably secondary to acute bronchitis.   Review of Systems: As presented in the history of presenting illness, rest negative.  Past Medical History  Diagnosis Date  . Bronchitis    Past Surgical History  Procedure Laterality Date  . Cholecystectomy    . Abdominal hysterectomy    . Appendectomy     Social History:  reports that she has been smoking.  She does not have any smokeless tobacco history on file. She reports that she drinks alcohol. She reports that she does not use illicit drugs. Where does patient live home. Can patient participate in ADLs? Yes.  No Known Allergies  Family History:  Family History  Problem Relation Age of Onset  . Lung cancer Father       Prior to Admission medications   Medication Sig Start Date End Date Taking? Authorizing Provider  HYDROcodone-acetaminophen (NORCO/VICODIN) 5-325 MG per tablet Take 1 tablet by mouth every 6 (six) hours as needed for pain.   Yes Historical Provider, MD  Tetrahydrozoline HCl (VISINE OP) Apply 1-2 drops to eye daily as needed (eye allergies).   Yes Historical Provider, MD    Physical Exam: Filed Vitals:   04/21/13 1937 04/21/13 2030 04/21/13 2114 04/21/13 2121  BP:   125/55    Pulse:  86 102   Temp:   97.2 F (36.2 C)   TempSrc:   Oral   Resp:  15 18   Height:   5\' 7"  (1.702 m)   Weight:   66.1 kg (145 lb 11.6 oz)   SpO2: 96% 95% 91% 94%     General:  Well-developed and nourished.  Eyes: Anicteric no pallor.  ENT: No discharge from the ears eyes nose mouth.  Neck: No mass felt.  Cardiovascular: S1-S2 heard.  Respiratory: Bilateral expiratory wheeze no crepitations.  Abdomen: Soft nontender bowel sounds present.  Skin: Patient has a rash in the right lower quadrant.  Musculoskeletal: No edema.  Psychiatric: Appears normal.  Neurologic: Alert awake oriented to time place and person. Moves all extremities.  Labs on Admission:  Basic Metabolic Panel:  Recent Labs Lab 04/21/13 1515  NA 138  K 4.4  CL 99  CO2 27  GLUCOSE 116*  BUN 8  CREATININE 0.60  CALCIUM 9.8   Liver Function Tests: No results found for this basename: AST, ALT, ALKPHOS, BILITOT, PROT, ALBUMIN,  in the last 168 hours No results found for this basename: LIPASE, AMYLASE,  in the last 168 hours No results found for this basename: AMMONIA,  in the last 168 hours CBC:  Recent Labs Lab 04/21/13 1515  WBC 12.0*  HGB 16.8*  HCT 47.1*  MCV 92.9  PLT 482*   Cardiac Enzymes: No results found for this basename: CKTOTAL, CKMB, CKMBINDEX, TROPONINI,  in the last 168  hours  BNP (last 3 results)  Recent Labs  04/21/13 1515  PROBNP 375.9*   CBG: No results found for this basename: GLUCAP,  in the last 168 hours  Radiological Exams on Admission: Dg Chest 2 View  04/21/2013   CLINICAL DATA:  Chest pressure and difficulty breathing.  EXAM: CHEST  2 VIEW  COMPARISON:  None.  FINDINGS: Mild opacity at the lung bases is most consistent with atelectasis. The lungs are otherwise clear. No pleural effusion or pneumothorax.  The cardiac silhouette is normal in size. The mediastinum is normal in contour. No hilar masses.  The bony thorax is intact.  IMPRESSION: No acute  cardiopulmonary disease.  Mild lung base atelectasis.   Electronically Signed   By: Amie Portland M.D.   On: 04/21/2013 15:44    EKG: Independently reviewed. Normal sinus rhythm with RSR pattern in anterior leads.  Assessment/Plan Principal Problem:   Acute respiratory failure with hypoxia Active Problems:   Acute bronchitis   1. Acute hypoxic respiratory failure - probably secondary to acute bronchitis. Patient has been placed on nebulizer Pulmicort IV steroids and antibiotics. Recheck BNP in a.m. Patient at this time is able to complete sentences without difficulty and will be managed in telemetry for now. 2. Tobacco abuse - strongly advised to quit smoking. 3. Alcohol abuse - patient has been placed on alcohol withdrawal protocol. 4. Chronic skin rash in the right lower quadrant - advised patient to followup with dermatologist.    Code Status: Full code.  Family Communication: Patient's husband at the bedside.  Disposition Plan: Admit to inpatient.    Coleson Kant N. [facility] Pager [phone].  If 7PM-7AM, please contact night-coverage [email or web address] Password [facility] 04/21/2013, 9:45 PM

## 2013-04-22 DIAGNOSIS — F1010 Alcohol abuse, uncomplicated: Secondary | ICD-10-CM

## 2013-04-22 DIAGNOSIS — F17200 Nicotine dependence, unspecified, uncomplicated: Secondary | ICD-10-CM

## 2013-04-22 DIAGNOSIS — R799 Abnormal finding of blood chemistry, unspecified: Secondary | ICD-10-CM

## 2013-04-22 LAB — CBC
HCT: 41.3 % (ref 36.0–46.0)
Hemoglobin: 14.8 g/dL (ref 12.0–15.0)
MCH: 33.4 pg (ref 26.0–34.0)
MCHC: 35.8 g/dL (ref 30.0–36.0)
MCV: 93.2 fL (ref 78.0–100.0)
RBC: 4.43 MIL/uL (ref 3.87–5.11)

## 2013-04-22 LAB — BASIC METABOLIC PANEL
CO2: 23 mEq/L (ref 19–32)
Chloride: 98 mEq/L (ref 96–112)
Glucose, Bld: 182 mg/dL — ABNORMAL HIGH (ref 70–99)
Potassium: 3.6 mEq/L (ref 3.5–5.1)
Sodium: 134 mEq/L — ABNORMAL LOW (ref 135–145)

## 2013-04-22 LAB — PRO B NATRIURETIC PEPTIDE: Pro B Natriuretic peptide (BNP): 361.4 pg/mL — ABNORMAL HIGH (ref 0–125)

## 2013-04-22 MED ORDER — ALBUTEROL SULFATE (5 MG/ML) 0.5% IN NEBU
2.5000 mg | INHALATION_SOLUTION | Freq: Three times a day (TID) | RESPIRATORY_TRACT | Status: DC
  Administered 2013-04-22 – 2013-04-23 (×6): 2.5 mg via RESPIRATORY_TRACT
  Filled 2013-04-22 (×6): qty 0.5

## 2013-04-22 MED ORDER — IPRATROPIUM BROMIDE 0.02 % IN SOLN
0.5000 mg | Freq: Three times a day (TID) | RESPIRATORY_TRACT | Status: DC
  Administered 2013-04-22 – 2013-04-23 (×6): 0.5 mg via RESPIRATORY_TRACT
  Filled 2013-04-22 (×5): qty 2.5

## 2013-04-22 NOTE — Progress Notes (Signed)
[facility] PROGRESS NOTE  Kathy Frost WUJ:[id number] DOB: 28-Feb-1951 DOA: 04/21/2013 PCP: No PCP Per Patient  Assessment/Plan: 1-acute resp failure with hypoxia: appears to be secondary to bronchitis/bronchiectasis and underline COPD (has never had PFT's, but is long term smoker) -will continue antibiotics -continue Nebs and pulmicort -will need outpatient PFT's -smoking cessation recommended -due to elevated BNP will check 2-D echo (might have dilated cardiomyopathy)  2-tobacco abuse: cessation counseling provided. Continue nicotine patch  3-Alcohol abuse: cessation couseling provided. Continue thiamine, folic acid and CIWA protocol. No withdrawal symptoms currently  4-chronic skin rash: unchanged. Will need dermatology evaluation in outpatient setting (might even need skin biopsy)  5-leukocytosis: due to bronchitis and steroids. Will continue abx's and will check WBC's trend  6-elevated BNP: as mentioned above will check 2-De cho.  DVT: Lovenox  Code Status: Full Family Communication: husband at bedside  Disposition Plan: home when medically stable  Consultants:  None   Procedures:  2-D echo pending  Antibiotics:  levaquin  HPI/Subjective: Afebrile, feeling better and breathing a lot more comfortable.  Objective: Filed Vitals:   04/22/13 1337  BP: 104/67  Pulse: 102  Temp: 97.9 F (36.6 C)  Resp: 20    Intake/Output Summary (Last 24 hours) at 04/22/13 1504 Last data filed at 04/22/13 1338  Gross per 24 hour  Intake   1043 ml  Output      1 ml  Net   1042 ml   Filed Weights   04/21/13 1501 04/21/13 2114 04/22/13 0538  Weight: 65.772 kg (145 lb) 66.1 kg (145 lb 11.6 oz) 65.2 kg (143 lb 11.8 oz)    Exam:   General:  NAD, afebrile; feeling a lot better  Cardiovascular: tachycardic, no rubs, no gallops, no murmurs, S1 and s2; no JVD  Respiratory: diffuse rhonchi, scattered exp wheezing, fair air movement  Abdomen: soft, NT, ND,  positive BS  Musculoskeletal: no edema, no cyanosis, no clubbing  Data Reviewed: Basic Metabolic Panel:  Recent Labs Lab 04/21/13 1515 04/22/13 0514  NA 138 134*  K 4.4 3.6  CL 99 98  CO2 27 23  GLUCOSE 116* 182*  BUN 8 6  CREATININE 0.60 0.48*  CALCIUM 9.8 9.2   CBC:  Recent Labs Lab 04/21/13 1515 04/22/13 0514  WBC 12.0* 9.8  HGB 16.8* 14.8  HCT 47.1* 41.3  MCV 92.9 93.2  PLT 482* 423*   BNP (last 3 results)  Recent Labs  04/21/13 1515 04/22/13 0514  PROBNP 375.9* 361.4*    Studies: Dg Chest 2 View  04/21/2013   CLINICAL DATA:  Chest pressure and difficulty breathing.  EXAM: CHEST  2 VIEW  COMPARISON:  None.  FINDINGS: Mild opacity at the lung bases is most consistent with atelectasis. The lungs are otherwise clear. No pleural effusion or pneumothorax.  The cardiac silhouette is normal in size. The mediastinum is normal in contour. No hilar masses.  The bony thorax is intact.  IMPRESSION: No acute cardiopulmonary disease.  Mild lung base atelectasis.   Electronically Signed   By: Amie Portland M.D.   On: 04/21/2013 15:44    Scheduled Meds: . albuterol  2.5 mg Nebulization TID  . budesonide (PULMICORT) nebulizer solution  0.25 mg Nebulization BID  . enoxaparin (LOVENOX) injection  40 mg Subcutaneous Q24H  . folic acid  1 mg Oral Daily  . ipratropium  0.5 mg Nebulization TID  . levofloxacin (LEVAQUIN) IV  750 mg Intravenous Q24H  . LORazepam  0-4 mg Intravenous Q6H  Followed by  . [START ON 04/23/2013] LORazepam  0-4 mg Intravenous Q12H  . methylPREDNISolone (SOLU-MEDROL) injection  40 mg Intravenous Q12H  . multivitamin with minerals  1 tablet Oral Daily  . pantoprazole  40 mg Oral Q1200  . sodium chloride  3 mL Intravenous Q12H  . sodium chloride  3 mL Intravenous Q12H  . thiamine  100 mg Oral Daily   Or  . thiamine  100 mg Intravenous Daily   Continuous Infusions:   Principal Problem:   Acute respiratory failure with hypoxia Active Problems:    Acute bronchitis    Time spent: > 30 minutes   Kathy Frost  [facility] Pager [phone]. If 7PM-7AM, please contact night-coverage at [email or web address], password [facility] 04/22/2013, 3:04 PM  LOS: 1 day

## 2013-04-22 NOTE — Progress Notes (Signed)
Utilization Review Completed Karesha Trzcinski J. Kindle Strohmeier, RN, BSN, NCM [phone]  

## 2013-04-23 DIAGNOSIS — J449 Chronic obstructive pulmonary disease, unspecified: Secondary | ICD-10-CM

## 2013-04-23 LAB — CBC
HCT: 42.3 % (ref 36.0–46.0)
MCH: 33.6 pg (ref 26.0–34.0)
MCV: 94 fL (ref 78.0–100.0)
Platelets: 447 10*3/uL — ABNORMAL HIGH (ref 150–400)
RBC: 4.5 MIL/uL (ref 3.87–5.11)
RDW: 14 % (ref 11.5–15.5)
WBC: 19 10*3/uL — ABNORMAL HIGH (ref 4.0–10.5)

## 2013-04-23 LAB — BASIC METABOLIC PANEL
CO2: 25 mEq/L (ref 19–32)
Chloride: 100 mEq/L (ref 96–112)
Creatinine, Ser: 0.62 mg/dL (ref 0.50–1.10)
GFR calc Af Amer: >90 mL/min (ref >90)
GFR calc non Af Amer: >90 mL/min (ref >90)
Potassium: 5.5 mEq/L — ABNORMAL HIGH (ref 3.5–5.1)

## 2013-04-23 MED ORDER — LEVOFLOXACIN 750 MG PO TABS
750.0000 mg | ORAL_TABLET | Freq: Every day | ORAL | Status: DC
  Administered 2013-04-23: 750 mg via ORAL
  Filled 2013-04-23 (×2): qty 1

## 2013-04-23 MED ORDER — PREDNISONE 20 MG PO TABS
40.0000 mg | ORAL_TABLET | Freq: Two times a day (BID) | ORAL | Status: DC
  Administered 2013-04-23 – 2013-04-24 (×2): 40 mg via ORAL
  Filled 2013-04-23 (×3): qty 2

## 2013-04-23 NOTE — Progress Notes (Signed)
[facility] PROGRESS NOTE  Kathy Frost ZOX:[id number] DOB: 06-02-1951 DOA: 04/21/2013 PCP: No PCP Per Patient  Assessment/Plan: 1-acute resp failure with hypoxia: appears to be secondary to bronchitis/bronchiectasis and underline COPD (has never had PFT's, but is long term smoker) -will continue antibiotics -continue Nebs and pulmicort -will need outpatient PFT's and pulmonology evaluation -smoking cessation recommended -due to elevated BNP will follow 2-D echo (might have dilated cardiomyopathy)  2-tobacco abuse: cessation counseling provided. Continue nicotine patch  3-Alcohol abuse: cessation couseling provided. Continue thiamine, folic acid and CIWA protocol. No withdrawal symptoms currently. -Will discontinue schedule Ativan.  4-chronic skin rash: unchanged. Will need dermatology evaluation in outpatient setting (might even need skin biopsy)  5-leukocytosis: due to bronchitis and steroids. Will continue abx's and will check WBC's trend  6-elevated BNP: as mentioned above will check 2-De cho.  DVT: Lovenox  Code Status: Full Family Communication: husband at bedside  Disposition Plan: home when medically stable  Consultants:  None   Procedures:  2-D echo pending  Antibiotics:  levaquin  HPI/Subjective: Afebrile, feeling better and breathing better. Increase somnolence from a schedule Ativan use.  Objective: Filed Vitals:   04/23/13 1315  BP: 117/58  Pulse: 100  Temp:   Resp: 20    Intake/Output Summary (Last 24 hours) at 04/23/13 1414 Last data filed at 04/23/13 1316  Gross per 24 hour  Intake   1833 ml  Output   2000 ml  Net   -167 ml   Filed Weights   04/21/13 2114 04/22/13 0538 04/23/13 0615  Weight: 66.1 kg (145 lb 11.6 oz) 65.2 kg (143 lb 11.8 oz) 65.5 kg (144 lb 6.4 oz)    Exam:   General:  NAD, afebrile; feeling a lot better  Cardiovascular: tachycardic, no rubs, no gallops, no murmurs, S1 and s2; no JVD  Respiratory: diffuse  rhonchi, mild wheezing, fair air movement  Abdomen: soft, NT, ND, positive BS  Musculoskeletal: no edema, no cyanosis, no clubbing  Data Reviewed: Basic Metabolic Panel:  Recent Labs Lab 04/21/13 1515 04/22/13 0514 04/23/13 0440  NA 138 134* 137  K 4.4 3.6 5.5*  CL 99 98 100  CO2 27 23 25   GLUCOSE 116* 182* 149*  BUN 8 6 13   CREATININE 0.60 0.48* 0.62  CALCIUM 9.8 9.2 9.3   CBC:  Recent Labs Lab 04/21/13 1515 04/22/13 0514 04/23/13 0440  WBC 12.0* 9.8 19.0*  HGB 16.8* 14.8 15.1*  HCT 47.1* 41.3 42.3  MCV 92.9 93.2 94.0  PLT 482* 423* 447*   BNP (last 3 results)  Recent Labs  04/21/13 1515 04/22/13 0514  PROBNP 375.9* 361.4*    Studies: Dg Chest 2 View  04/21/2013   CLINICAL DATA:  Chest pressure and difficulty breathing.  EXAM: CHEST  2 VIEW  COMPARISON:  None.  FINDINGS: Mild opacity at the lung bases is most consistent with atelectasis. The lungs are otherwise clear. No pleural effusion or pneumothorax.  The cardiac silhouette is normal in size. The mediastinum is normal in contour. No hilar masses.  The bony thorax is intact.  IMPRESSION: No acute cardiopulmonary disease.  Mild lung base atelectasis.   Electronically Signed   By: Amie Portland M.D.   On: 04/21/2013 15:44    Scheduled Meds: . albuterol  2.5 mg Nebulization TID  . budesonide (PULMICORT) nebulizer solution  0.25 mg Nebulization BID  . enoxaparin (LOVENOX) injection  40 mg Subcutaneous Q24H  . folic acid  1 mg Oral Daily  . ipratropium  0.5  mg Nebulization TID  . levofloxacin  750 mg Oral QHS  . multivitamin with minerals  1 tablet Oral Daily  . pantoprazole  40 mg Oral Q1200  . predniSONE  40 mg Oral BID  . sodium chloride  3 mL Intravenous Q12H  . sodium chloride  3 mL Intravenous Q12H  . thiamine  100 mg Oral Daily   Continuous Infusions:   Principal Problem:   Acute respiratory failure with hypoxia Active Problems:   Acute bronchitis    Time spent:  30  minutes   [name]  [facility] Pager [phone]. If 7PM-7AM, please contact night-coverage at [email or web address], password [facility] 04/23/2013, 2:14 PM  LOS: 2 days

## 2013-04-23 NOTE — Progress Notes (Signed)
Pt.is A/Ox4 and can ambulate without assistance. Pt.ambulated nurses station during the night without oxygen and had no c/o shortness of breath. She is not oxygen dependent at home. She had no c/o pain and no signs of distress during the shift.

## 2013-04-23 NOTE — Progress Notes (Signed)
PHARMACIST - PHYSICIAN COMMUNICATION DR:   Gwenlyn Perking CONCERNING: Antibiotic IV to Oral Route Change Policy  RECOMMENDATION: This patient is receiving levaquin by the intravenous route.  Based on criteria approved by the Pharmacy and Therapeutics Committee, the antibiotic(s) is/are being converted to the equivalent oral dose form(s).   DESCRIPTION: These criteria include:  Patient being treated for a respiratory tract infection, urinary tract infection, cellulitis or clostridium difficile associated diarrhea if on metronidazole  The patient is not neutropenic and does not exhibit a GI malabsorption state  The patient is eating (either orally or via tube) and/or has been taking other orally administered medications for a least 24 hours  The patient is improving clinically and has a Tmax < 100.5  If you have questions about this conversion, please contact the Pharmacy Department  []   [phone] )  [facility] [x]   [phone] )  [facility]  []   [phone] )  [facility] []   [phone] )  Ilene Qua   Sheppard Coil PharmD., BCPS Clinical Pharmacist Pager [phone] 04/23/2013 10:45 AM

## 2013-04-23 NOTE — Progress Notes (Signed)
  Echocardiogram 2D Echocardiogram has been performed.  Kathy Frost 04/23/2013, 11:40 AM

## 2013-04-24 DIAGNOSIS — R0902 Hypoxemia: Secondary | ICD-10-CM

## 2013-04-24 LAB — BASIC METABOLIC PANEL
CO2: 27 mEq/L (ref 19–32)
Calcium: 9.7 mg/dL (ref 8.4–10.5)
Chloride: 99 mEq/L (ref 96–112)
GFR calc Af Amer: >90 mL/min (ref >90)
Glucose, Bld: 116 mg/dL — ABNORMAL HIGH (ref 70–99)
Sodium: 140 mEq/L (ref 135–145)

## 2013-04-24 MED ORDER — ADULT MULTIVITAMIN W/MINERALS CH: 1.0000 | ORAL_TABLET | Freq: Every day | ORAL | Status: DC

## 2013-04-24 MED ORDER — NICOTINE 21 MG/24HR TD PT24: 1.0000 | MEDICATED_PATCH | TRANSDERMAL | Status: DC

## 2013-04-24 MED ORDER — ALBUTEROL SULFATE HFA 108 (90 BASE) MCG/ACT IN AERS: 2.0000 | INHALATION_SPRAY | Freq: Four times a day (QID) | RESPIRATORY_TRACT | Status: DC | PRN

## 2013-04-24 MED ORDER — TIOTROPIUM BROMIDE MONOHYDRATE 18 MCG IN CAPS: 18.0000 ug | ORAL_CAPSULE | Freq: Every day | RESPIRATORY_TRACT | Status: DC

## 2013-04-24 MED ORDER — DOXYCYCLINE HYCLATE 100 MG PO TABS
100.0000 mg | ORAL_TABLET | Freq: Two times a day (BID) | ORAL | Status: DC
  Filled 2013-04-24 (×2): qty 1

## 2013-04-24 MED ORDER — DOXYCYCLINE HYCLATE 100 MG PO TABS: 100.0000 mg | ORAL_TABLET | Freq: Two times a day (BID) | ORAL | Status: DC

## 2013-04-24 MED ORDER — BUDESONIDE 0.25 MG/2ML IN SUSP: 0.2500 mg | Freq: Two times a day (BID) | RESPIRATORY_TRACT | Status: DC

## 2013-04-24 MED ORDER — PREDNISONE 10 MG PO TABS: ORAL_TABLET | ORAL | Status: DC

## 2013-04-24 NOTE — Progress Notes (Signed)
[facility] PROGRESS NOTE Assessment/Plan: Acute respiratory failure with hypoxia/  Acute bronchitis -Continue antibiotics  -continue Nebs and pulmicort  -will need outpatient PFT's and pulmonology evaluation  -smoking cessation recommended  Tobacco abuse:  - Cessation counseling provided.  - Continue nicotine patch.  Alcohol abuse:  - Cessation couseling provided.  - Continue thiamine, folic acid and CIWA protocol. No withdrawal symptoms currently.  -Will discontinue schedule Ativan.   Chronic skin rash:  - Unchanged.  - Will need dermatology evaluation in outpatient setting (might even need skin biopsy)     Code Status: Full  Family Communication: husband at bedside  Disposition Plan: home when medically stable    Consultants:  none  Procedures:  none  Antibiotics:  Doxy  HPI/Subjective: No complains. Breathing feels at baseline  Objective: Filed Vitals:   04/23/13 1432 04/23/13 2010 04/23/13 2059 04/24/13 0541  BP:   102/55 107/69  Pulse:   85 87  Temp:   97.7 F (36.5 C) 98 F (36.7 C)  TempSrc:   Oral Oral  Resp:   20 19  Height:      Weight:    64.5 kg (142 lb 3.2 oz)  SpO2: 92% 94% 94% 93%    Intake/Output Summary (Last 24 hours) at 04/24/13 1053 Last data filed at 04/24/13 0855  Gross per 24 hour  Intake   1203 ml  Output   1700 ml  Net   -497 ml   Filed Weights   04/22/13 0538 04/23/13 0615 04/24/13 0541  Weight: 65.2 kg (143 lb 11.8 oz) 65.5 kg (144 lb 6.4 oz) 64.5 kg (142 lb 3.2 oz)    Exam:  General: Alert, awake, oriented x3, in no acute distress.  HEENT: No bruits, no goiter.  Heart: Regular rate and rhythm, without murmurs, rubs, gallops.  Lungs: Good air movement, clear to auscultation Abdomen: Soft, nontender, nondistended, positive bowel sounds.  Neuro: Grossly intact, nonfocal.   Data Reviewed: Basic Metabolic Panel:  Recent Labs Lab 04/21/13 1515 04/22/13 0514 04/23/13 0440 04/24/13 0548  NA 138  134* 137 140  K 4.4 3.6 5.5* 4.6  CL 99 98 100 99  CO2 27 23 25 27   GLUCOSE 116* 182* 149* 116*  BUN 8 6 13 15   CREATININE 0.60 0.48* 0.62 0.62  CALCIUM 9.8 9.2 9.3 9.7   Liver Function Tests: No results found for this basename: AST, ALT, ALKPHOS, BILITOT, PROT, ALBUMIN,  in the last 168 hours No results found for this basename: LIPASE, AMYLASE,  in the last 168 hours No results found for this basename: AMMONIA,  in the last 168 hours CBC:  Recent Labs Lab 04/21/13 1515 04/22/13 0514 04/23/13 0440  WBC 12.0* 9.8 19.0*  HGB 16.8* 14.8 15.1*  HCT 47.1* 41.3 42.3  MCV 92.9 93.2 94.0  PLT 482* 423* 447*   Cardiac Enzymes: No results found for this basename: CKTOTAL, CKMB, CKMBINDEX, TROPONINI,  in the last 168 hours BNP (last 3 results)  Recent Labs  04/21/13 1515 04/22/13 0514  PROBNP 375.9* 361.4*   CBG: No results found for this basename: GLUCAP,  in the last 168 hours  No results found for this or any previous visit (from the past 240 hour(s)).   Studies: No results found.  Scheduled Meds: . albuterol  2.5 mg Nebulization TID  . budesonide (PULMICORT) nebulizer solution  0.25 mg Nebulization BID  . enoxaparin (LOVENOX) injection  40 mg Subcutaneous Q24H  . folic acid  1 mg Oral Daily  . ipratropium  0.5 mg Nebulization TID  . levofloxacin  750 mg Oral QHS  . multivitamin with minerals  1 tablet Oral Daily  . pantoprazole  40 mg Oral Q1200  . predniSONE  40 mg Oral BID  . sodium chloride  3 mL Intravenous Q12H  . sodium chloride  3 mL Intravenous Q12H  . thiamine  100 mg Oral Daily   Continuous Infusions:    Marinda Elk  [facility] Pager [phone]. If 8PM-8AM, please contact night-coverage at [email or web address], password [facility] 04/24/2013, 10:53 AM  LOS: 3 days

## 2013-04-24 NOTE — Discharge Summary (Signed)
Physician Discharge Summary  Kathy Frost:[id number] DOB: [birth year] DOA: 04/21/2013  PCP: No PCP Per Patient  Admit date: 04/21/2013 Discharge date: 04/24/2013  Time spent: 35 minutes  Recommendations for Outpatient Follow-up:  1. Follow up with Pulmonary as an outpatient 2 week. 2. Will need PFT's  Discharge Diagnoses:  Principal Problem:   Acute respiratory failure with hypoxia Active Problems:   Acute bronchitis   Discharge Condition: stable  Diet recommendation: heart healthy  Filed Weights   04/22/13 0538 04/23/13 0615 04/24/13 0541  Weight: 65.2 kg (143 lb 11.8 oz) 65.5 kg (144 lb 6.4 oz) 64.5 kg (142 lb 3.2 oz)    History of present illness:  62 y.o. female with history of tobacco abuse presents with complaints of worsening shortness of breath over the last 2 weeks. Patient has been short of breath even at rest and is associated wheezing. In the ER patient had required 4 L of oxygen to maintain saturation. Patient denies any chest pain. Has been having subjective feeling of fever and chills with productive cough. Denies any nausea vomiting abdominal pain or diarrhea. Patient has been a minute for acute hypoxic respiratory failure probably secondary to acute bronchitis.    Hospital Course:  Acute respiratory failure with hypoxia/ Acute bronchitis: - treated with ab'x, inhalers and steroids -Continue antibiotics orally. -continue Nebs and pulmicort. -will need outpatient PFT's and pulmonology evaluation  -smoking cessation recommended   Tobacco abuse:  - Cessation counseling provided.  - Continue nicotine patch.   Alcohol abuse:  - Cessation couseling provided.  - Continue thiamine, folic acid and CIWA protocol. No withdrawal symptoms currently.  -Will discontinue schedule Ativan.   Chronic skin rash:  - Unchanged.  - Will need dermatology evaluation in outpatient setting (might even need skin biopsy)   Procedures:  ECHO: EF  WNL.  Consultations:  none  Discharge Exam: Filed Vitals:   04/24/13 0541  BP: 107/69  Pulse: 87  Temp: 98 F (36.7 C)  Resp: 19    General: A&O x3 Cardiovascular: RRR Respiratory: good air movement CTA B/L  Discharge Instructions      Discharge Orders   Future Orders Complete By Expires   Diet - low sodium heart healthy  As directed    Increase activity slowly  As directed        Medication List         albuterol 108 (90 BASE) MCG/ACT inhaler  Commonly known as:  PROVENTIL HFA;VENTOLIN HFA  Inhale 2 puffs into the lungs every 6 (six) hours as needed for wheezing.     budesonide 0.25 MG/2ML nebulizer solution  Commonly known as:  PULMICORT  Take 2 mLs (0.25 mg total) by nebulization 2 (two) times daily.     doxycycline 100 MG tablet  Commonly known as:  VIBRA-TABS  Take 1 tablet (100 mg total) by mouth every 12 (twelve) hours.     HYDROcodone-acetaminophen 5-325 MG per tablet  Commonly known as:  NORCO/VICODIN  Take 1 tablet by mouth every 6 (six) hours as needed for pain.     multivitamin with minerals Tabs tablet  Take 1 tablet by mouth daily.     nicotine 21 mg/24hr patch  Commonly known as:  NICODERM CQ - dosed in mg/24 hours  Place 1 patch onto the skin daily.     predniSONE 10 MG tablet  Commonly known as:  DELTASONE  Takes 6 tablets for 1 days, then 5 tablets for 1 days, then 4 tablets for 1 days,  then 3 tablets for 1 days, then 2 tabs for 1 days, then 1 tab for 1 days, and then stop.     tiotropium 18 MCG inhalation capsule  Commonly known as:  SPIRIVA HANDIHALER  Place 1 capsule (18 mcg total) into inhaler and inhale daily.     VISINE OP  Apply 1-2 drops to eye daily as needed (eye allergies).       No Known Allergies Follow-up Information   Follow up with [facility] In 2 weeks. (hospital follow up)    Specialty:  Pulmonology   Contact information:   [address] [place] [address] [phone]       The  results of significant diagnostics from this hospitalization (including imaging, microbiology, ancillary and laboratory) are listed below for reference.    Significant Diagnostic Studies: Dg Chest 2 View  04/21/2013   CLINICAL DATA:  Chest pressure and difficulty breathing.  EXAM: CHEST  2 VIEW  COMPARISON:  None.  FINDINGS: Mild opacity at the lung bases is most consistent with atelectasis. The lungs are otherwise clear. No pleural effusion or pneumothorax.  The cardiac silhouette is normal in size. The mediastinum is normal in contour. No hilar masses.  The bony thorax is intact.  IMPRESSION: No acute cardiopulmonary disease.  Mild lung base atelectasis.   Electronically Signed   By: Amie Portland M.D.   On: 04/21/2013 15:44    Microbiology: No results found for this or any previous visit (from the past 240 hour(s)).   Labs: Basic Metabolic Panel:  Recent Labs Lab 04/21/13 1515 04/22/13 0514 04/23/13 0440 04/24/13 0548  NA 138 134* 137 140  K 4.4 3.6 5.5* 4.6  CL 99 98 100 99  CO2 27 23 25 27   GLUCOSE 116* 182* 149* 116*  BUN 8 6 13 15   CREATININE 0.60 0.48* 0.62 0.62  CALCIUM 9.8 9.2 9.3 9.7   Liver Function Tests: No results found for this basename: AST, ALT, ALKPHOS, BILITOT, PROT, ALBUMIN,  in the last 168 hours No results found for this basename: LIPASE, AMYLASE,  in the last 168 hours No results found for this basename: AMMONIA,  in the last 168 hours CBC:  Recent Labs Lab 04/21/13 1515 04/22/13 0514 04/23/13 0440  WBC 12.0* 9.8 19.0*  HGB 16.8* 14.8 15.1*  HCT 47.1* 41.3 42.3  MCV 92.9 93.2 94.0  PLT 482* 423* 447*   Cardiac Enzymes: No results found for this basename: CKTOTAL, CKMB, CKMBINDEX, TROPONINI,  in the last 168 hours BNP: BNP (last 3 results)  Recent Labs  04/21/13 1515 04/22/13 0514  PROBNP 375.9* 361.4*   CBG: No results found for this basename: GLUCAP,  in the last 168 hours     Signed:  Marinda Elk  [facility] 04/24/2013, 11:20 AM

## 2013-04-24 NOTE — Progress Notes (Signed)
Pt.is A/Ox4 and is ambulatory without assistance. She ambulates down the halls without any signs of distress and without oxygen. She had no c/o pain during the 7p-7a shift.

## 2013-04-25 ENCOUNTER — Telehealth: Payer: Self-pay | Admitting: Internal Medicine

## 2013-04-25 DIAGNOSIS — J9601 Acute respiratory failure with hypoxia: Secondary | ICD-10-CM

## 2013-04-25 NOTE — Telephone Encounter (Signed)
Order has been sent to [facility]. Pt is aware.

## 2013-04-25 NOTE — Telephone Encounter (Signed)
Ok to order neb but must make appt for next avail and bring all active meds with her

## 2013-04-25 NOTE — Telephone Encounter (Signed)
Pt is seeing MW on 05/08/13 for new consult. Pt was recently hospitalized but do not see where pulmonary saw her. Dr. Sherene Sires please advise if okay to order pt a neb machine thanks

## 2013-05-08 ENCOUNTER — Ambulatory Visit (INDEPENDENT_AMBULATORY_CARE_PROVIDER_SITE_OTHER): Payer: BC Managed Care – PPO | Admitting: Internal Medicine

## 2013-05-08 ENCOUNTER — Encounter: Payer: Self-pay | Admitting: Internal Medicine

## 2013-05-08 VITALS — BP 110/72 | HR 73 | Temp 97.6°F | Ht 66.5 in | Wt 144.0 lb

## 2013-05-08 DIAGNOSIS — J449 Chronic obstructive pulmonary disease, unspecified: Secondary | ICD-10-CM | POA: Insufficient documentation

## 2013-05-08 MED ORDER — BUDESONIDE-FORMOTEROL FUMARATE 160-4.5 MCG/ACT IN AERO: INHALATION_SPRAY | RESPIRATORY_TRACT | Status: DC

## 2013-05-08 NOTE — Progress Notes (Signed)
Subjective:    Patient ID: Kathy Frost, female    DOB: Jul 03, 1951    MRN: [id number]  HPI  62 yowf lifetime smoker with new onset sob x spring 2014 referred 05/08/2013 to pulmonary clinic by Dr Robb Matar p admit 04/21/13 with ? aecopd with hypoxemic resp failure.   62 yowf lifetime smoker/ Kathy Frost cc indolent onset progressive doe x 6 month x more than adls such as vaccuming, yard work,  much worse since onset of cough early October rx as bronchitis with zpak prior to admit  Admit date: 04/21/2013 Pulmonary office visit/ Kathy Frost cc indolent onset progressive doe x 6 month x more than adls such as vaccuming, yard work,  much worse since onset of cough early October rx as bronchitis with zpak prior to admit  Admit date: 04/21/2013  Discharge date: 04/24/2013 Discharge Diagnoses:  Principal Problem:  Acute respiratory failure with hypoxia  Active Problems:  Acute bronchitis  Discharge Condition: stable  Diet recommendation: heart healthy  Filed Weights    04/22/13 0538  04/23/13 0615  04/24/13 0541   Weight:  65.2 kg (143 lb 11.8 oz)  65.5 kg (144 lb 6.4 oz)  64.5 kg (142 lb 3.2 oz)   History of present illness:  62 y.o. female with history of tobacco abuse presents with complaints of worsening shortness of breath over the last 2 weeks. Patient has been short of breath even at rest and is associated wheezing. In the ER patient had required 4 L of oxygen to maintain saturation. Patient denies any chest pain. Has been having subjective feeling of fever and chills with productive cough. Denies any nausea vomiting abdominal pain or diarrhea. Patient has been a minute for acute hypoxic respiratory failure probably secondary to acute bronchitis.  Hospital Course:  Acute respiratory failure with hypoxia/ Acute bronchitis:  - treated with ab'x, inhalers and steroids  -Continue antibiotics orally.  -continue Nebs and pulmicort.  -will need outpatient PFT's and pulmonology evaluation  -smoking cessation recommended  Tobacco abuse:  - Cessation counseling provided.  - Continue nicotine patch.  Alcohol abuse:  - Cessation couseling provided.  - Continue thiamine, folic acid and CIWA protocol. No withdrawal  symptoms currently.  -Will discontinue schedule Ativan.  Chronic skin rash:  - Unchanged.  - Will need dermatology evaluation in outpatient setting (might even need skin biopsy)  Procedures:  ECHO: EF WNL      On day of ov much better, finished prednisone 05/01/13 and on spiriva 2 puffs each am and pulmicort bid per neb cc dry mouth and no longer limited from desired activities but not back to nl level of activity yet  No obvious day to day or daytime variabilty or assoc chronic cough or cp or chest tightness, subjective wheeze overt sinus or hb symptoms. No unusual exp hx or h/o childhood pna/ asthma or knowledge of premature birth.  Sleeping ok without nocturnal  or early am exacerbation  of respiratory  c/o's or need for noct saba. Also denies any obvious fluctuation of symptoms with weather or environmental changes or other aggravating or alleviating factors except as outlined above   Current Medications, Allergies, Complete Past Medical History, Past Surgical History, Family History, and Social History were reviewed in [employer] record.            Review of Systems  Constitutional: Negative for fever, chills and unexpected weight change.  HENT: Negative for congestion, dental problem, ear pain, nosebleeds, postnasal drip, rhinorrhea, sinus pressure, sneezing, sore throat, trouble swallowing and voice change.   Eyes: Negative for visual disturbance.  Respiratory: Positive for cough and shortness of breath. Negative for choking.   Cardiovascular: Negative for chest pain and leg swelling.  Gastrointestinal: Negative for vomiting,  abdominal pain and diarrhea.  Genitourinary: Negative for difficulty urinating.  Musculoskeletal: Negative for arthralgias.  Skin: Negative for rash.  Neurological: Negative for tremors, syncope and headaches.  Hematological: Does not bruise/bleed easily.       Objective:   Physical Exam  amb thin wf nad Wt Readings from  Last 3 Encounters:  05/08/13 144 lb (65.318 kg)  04/24/13 142 lb 3.2 oz (64.5 kg)  12/03/12 148 lb (67.132 kg)      HEENT mild turbinate edema.  Oropharynx no thrush or excess pnd or cobblestoning.  No JVD or cervical adenopathy. Mild accessory muscle hypertrophy. Trachea midline, nl thryroid. Chest was hyperinflated by percussion with diminished breath sounds and moderate increased exp time without wheeze. Hoover sign positive at mid inspiration. Regular rate and rhythm without murmur gallop or rub or increase P2 or edema.  Abd: no hsm, nl excursion. Ext warm without cyanosis or clubbing.    cxr 04/21/13 No acute cardiopulmonary disease.  Mild lung base atelectasis.       Assessment & Plan:

## 2013-05-08 NOTE — Assessment & Plan Note (Addendum)
DDX of  difficult airways managment all start with A and  include Adherence, Ace Inhibitors, Acid Reflux, Active Sinus Disease, Alpha 1 Antitripsin deficiency, Anxiety masquerading as Airways dz,  ABPA,  allergy(esp in young), Aspiration (esp in elderly), Adverse effects of DPI,  Active smokers, plus two Bs  = Bronchiectasis and Beta blocker use..and one C= CHF   Adherence is always the initial "prime suspect" and is a multilayered concern that requires a "trust but verify" approach in every patient - starting with knowing how to use medications, especially inhalers, correctly, keeping up with refills and understanding the fundamental difference between maintenance and prns vs those medications only taken for a very short course and then stopped and not refilled.  - The proper method of use, as well as anticipated side effects, of a metered-dose inhaler are discussed and demonstrated to the patient. Improved effectiveness after extensive coaching during this visit to a level of approximately  90% so should do ok on just symbicort 160 2bid as did not have any rx prior to the exac  ?Active smoking > discussed importance of maintaining off, the most important aspect of her her. ?Adverse effect of dpi, esp dry mouth from spiriva > try off and just use saba prn

## 2013-05-08 NOTE — Patient Instructions (Signed)
A = automatic = symbicort 160 Take 2 puffs first thing in am and then another 2 puffs about 12 hours later.   Plan B = Backup = Only use your albuterol as a rescue medication to be used if you can't catch your breath by resting or doing a relaxed purse lip breathing pattern.  - The less you use it, the better it will work when you need it. - Ok to use up to every 4 hours if you must but call for immediate appointment if use goes up over your usual need - Don't leave home without it !!  (think of it like your spare tire for your car)   Please schedule a follow up office visit in 6 weeks, call sooner if needed pft's

## 2013-05-31 ENCOUNTER — Telehealth: Payer: Self-pay | Admitting: Internal Medicine

## 2013-05-31 DIAGNOSIS — J449 Chronic obstructive pulmonary disease, unspecified: Secondary | ICD-10-CM

## 2013-05-31 MED ORDER — TIOTROPIUM BROMIDE MONOHYDRATE 18 MCG IN CAPS: 18.0000 ug | ORAL_CAPSULE | Freq: Every day | RESPIRATORY_TRACT | Status: DC

## 2013-05-31 NOTE — Telephone Encounter (Signed)
I called and spoke with pt. She reports she feels like she does better on spiriva than the symbicort . She wants RX for spiriva sent. Please advise MW thanks

## 2013-05-31 NOTE — Telephone Encounter (Signed)
Ok with me 

## 2013-05-31 NOTE — Telephone Encounter (Signed)
Pt aware and rx sent 

## 2013-06-18 ENCOUNTER — Other Ambulatory Visit: Payer: Self-pay | Admitting: Internal Medicine

## 2013-06-18 DIAGNOSIS — J449 Chronic obstructive pulmonary disease, unspecified: Secondary | ICD-10-CM

## 2013-06-19 ENCOUNTER — Ambulatory Visit (INDEPENDENT_AMBULATORY_CARE_PROVIDER_SITE_OTHER): Payer: BC Managed Care – PPO | Admitting: Internal Medicine

## 2013-06-19 ENCOUNTER — Encounter: Payer: Self-pay | Admitting: Internal Medicine

## 2013-06-19 ENCOUNTER — Encounter (INDEPENDENT_AMBULATORY_CARE_PROVIDER_SITE_OTHER): Payer: Self-pay

## 2013-06-19 VITALS — BP 110/68 | HR 88 | Ht 67.0 in | Wt 146.0 lb

## 2013-06-19 DIAGNOSIS — J449 Chronic obstructive pulmonary disease, unspecified: Secondary | ICD-10-CM

## 2013-06-19 LAB — PULMONARY FUNCTION TEST
DL/VA: 3.71 ml/min/mmHg/L
DLCO unc % pred: 62 %
DLCO unc: 17.65 ml/min/mmHg
FEF 25-75 Post: 0.99 L/sec
FEF2575-%Change-Post: -12 %
FEF2575-%Pred-Post: 40 %
FEF2575-%Pred-Pre: 46 %
FEV1-%Pred-Pre: 71 %
FEV1-Post: 1.92 L
FEV1FVC-%Change-Post: 0 %
FEV6-%Change-Post: -3 %
FEV6-Pre: 2.99 L
FEV6FVC-%Pred-Post: 102 %
FEV6FVC-%Pred-Pre: 102 %
FVC-%Pred-Post: 81 %
Post FEV1/FVC ratio: 65 %
Post FEV6/FVC ratio: 99 %
Pre FEV1/FVC ratio: 65 %
Pre FEV6/FVC Ratio: 98 %

## 2013-06-19 NOTE — Progress Notes (Signed)
Subjective:    Patient ID: Kathy Frost, female    DOB: Nov 18, 1950    MRN: [id number]   Brief patient profile:  62 yowf quit smoking 04/22/13  with new onset sob x spring 2014 referred 05/08/2013 to pulmonary clinic by Dr Robb Matar p admit 04/21/13 with ? aecopd with hypoxemic resp failure.  History of Present Illness  05/08/2013 1st [facility] office visit/ Dmarion Perfect cc indolent onset progressive doe x 6 month x more than adls such as vaccuming, yard work,  much worse since onset of cough early October rx as bronchitis with zpak prior to admit  Admit date: 04/21/2013  Discharge date: 04/24/2013 Discharge Diagnoses:  Principal Problem:  Acute respiratory failure with hypoxia  Active Problems:  Acute bronchitis  Discharge Condition: stable  Diet recommendation: heart healthy  Filed Weights    04/22/13 0538  04/23/13 0615  04/24/13 0541   Weight:  65.2 kg (143 lb 11.8 oz)  65.5 kg (144 lb 6.4 oz)  64.5 kg (142 lb 3.2 oz)   History of present illness:  62 y.o. female with history of tobacco abuse presents with complaints of worsening shortness of breath over the last 2 weeks. Patient has been short of breath even at rest and is associated wheezing. In the ER patient had required 4 L of oxygen to maintain saturation. Patient denies any chest pain. Has been having subjective feeling of fever and chills with productive cough. Denies any nausea vomiting abdominal pain or diarrhea. Patient has been a minute for acute hypoxic respiratory failure probably secondary to acute bronchitis.  Hospital Course:  Acute respiratory failure with hypoxia/ Acute bronchitis:  - treated with ab'x, inhalers and steroids  -Continue antibiotics orally.  -continue Nebs and pulmicort.  -will need outpatient PFT's and pulmonology evaluation  -smoking cessation recommended  Tobacco abuse:  - Cessation counseling provided.  - Continue nicotine patch.  Alcohol abuse:  - Cessation couseling provided.  - Continue  thiamine, folic acid and CIWA protocol. No withdrawal symptoms currently.  -Will discontinue schedule Ativan.  Chronic skin rash:  - Unchanged.  - Will need dermatology evaluation in outpatient setting (might even need skin biopsy)  Procedures:  ECHO: EF WNL   On day of ov much better, finished prednisone 05/01/13 and on spiriva 2 puffs each am and pulmicort bid per neb cc dry mouth and no longer limited from desired activities but not back to nl level of activity yet rec A = automatic = symbicort 160 Take 2 puffs first thing in am and then another 2 puffs about 12 hours later.  Plan B = Backup = Only use your albuterol as a rescue medication    06/19/2013 f/u ov/Hafsah Hendler re: copd GOLD II  Chief Complaint  Patient presents with  . COPD    Breathing is unchanged. Had PFT today. Denies SOB, chest tightness, coughing or wheezing.   Doing great, no cough prefers spiriva, no saba at all , no limits to activity due to sob  No obvious day to day or daytime variabilty or assoc chronic cough or cp or chest tightness, subjective wheeze overt sinus or hb symptoms. No unusual exp hx or h/o childhood pna/ asthma or knowledge of premature birth.  Sleeping ok without nocturnal  or early am exacerbation  of respiratory  c/o's or need for noct saba. Also denies any obvious fluctuation of symptoms with weather or environmental changes or other aggravating or alleviating factors except as outlined above   Current Medications, Allergies, Complete Past  Medical History, Past Surgical History, Family History, and Social History were reviewed in [employer] record.  ROS  The following are not active complaints unless bolded sore throat, dysphagia, dental problems, itching, sneezing,  nasal congestion or excess/ purulent secretions, ear ache,   fever, chills, sweats, unintended wt loss, pleuritic or exertional cp, hemoptysis,  orthopnea pnd or leg swelling, presyncope, palpitations, heartburn,  abdominal pain, anorexia, nausea, vomiting, diarrhea  or change in bowel or urinary habits, change in stools or urine, dysuria,hematuria,  rash, arthralgias, visual complaints, headache, numbness weakness or ataxia or problems with walking or coordination,  change in mood/affect or memory.              Objective:   Physical Exam  amb thin wf nad  06/19/2013      146  Wt Readings from Last 3 Encounters:  05/08/13 144 lb (65.318 kg)  04/24/13 142 lb 3.2 oz (64.5 kg)  12/03/12 148 lb (67.132 kg)      HEENT mild turbinate edema.  Oropharynx no thrush or excess pnd or cobblestoning.  No JVD or cervical adenopathy. Mild accessory muscle hypertrophy. Trachea midline, nl thryroid. Chest was mildly hyperinflated by percussion with diminished breath sounds and mild increased exp time without wheeze. Hoover sign positive at late inspiration. Regular rate and rhythm without murmur gallop or rub or increase P2 or edema.  Abd: no hsm, nl excursion. Ext warm without cyanosis or clubbing.    cxr 04/21/13 No acute cardiopulmonary disease.  Mild lung base atelectasis.       Assessment & Plan:

## 2013-06-19 NOTE — Assessment & Plan Note (Signed)
-   requested spiriva over symbicort as maint 05/31/2013  - PFT's  06/19/2013  FEV1  1.98 (71%) ratio 65 and no better B2,  DLCO 62 corrects to 86% -  06/19/2013 p extensive coaching HFA effectiveness =    90%   She's doing fine on just spiriva with no need for saba and may not even require LAMA longterm if maintains off cigs    Each maintenance medication was reviewed in detail including most importantly the difference between maintenance and as needed and under what circumstances the prns are to be used.  Please see instructions for details which were reviewed in writing and the patient given a copy.

## 2013-06-19 NOTE — Progress Notes (Signed)
PFT done today. 

## 2013-06-19 NOTE — Patient Instructions (Addendum)
GOLD II copd = mild,  Unlikely progress significantly unless resume smoker   Plan A = automatic = spiriva one daily  - if mouth too dry first try off it to see what difference it makes and when you need more plan B and if so return here  Plan B = Backup = Only use your albuterol (ventolin) as a rescue medication to be used if you can't catch your breath by resting or doing a relaxed purse lip breathing pattern.  - The less you use it, the better it will work when you need it. - Ok to use up to every 4 hours if you must but call for immediate appointment if use goes up over your usual need - Don't leave home without it !!  (think of it like your spare tire for your car)     If you are satisfied with your treatment plan let your doctor know and he/she can either refill your medications or you can return here when your prescription runs out.     If in any way you are not 100% satisfied,  please tell us.  If 100% better, tell your friends!

## 2013-07-01 ENCOUNTER — Telehealth: Payer: Self-pay | Admitting: Internal Medicine

## 2013-07-01 ENCOUNTER — Encounter: Payer: Self-pay | Admitting: Internal Medicine

## 2013-07-01 ENCOUNTER — Ambulatory Visit (INDEPENDENT_AMBULATORY_CARE_PROVIDER_SITE_OTHER): Payer: BC Managed Care – PPO | Admitting: Internal Medicine

## 2013-07-01 VITALS — BP 100/64 | HR 83 | Temp 98.0°F | Ht 67.0 in | Wt 145.0 lb

## 2013-07-01 DIAGNOSIS — J4489 Other specified chronic obstructive pulmonary disease: Secondary | ICD-10-CM

## 2013-07-01 DIAGNOSIS — J449 Chronic obstructive pulmonary disease, unspecified: Secondary | ICD-10-CM

## 2013-07-01 MED ORDER — AZITHROMYCIN 250 MG PO TABS: ORAL_TABLET | ORAL | Status: DC

## 2013-07-01 MED ORDER — BUDESONIDE-FORMOTEROL FUMARATE 160-4.5 MCG/ACT IN AERO: INHALATION_SPRAY | RESPIRATORY_TRACT | Status: DC

## 2013-07-01 MED ORDER — PREDNISONE 10 MG PO TABS: ORAL_TABLET | ORAL | Status: DC

## 2013-07-01 NOTE — Progress Notes (Signed)
Subjective:    Patient ID: Kathy Frost, female    DOB: [birth year]    MRN: [id number]   Brief patient profile:  62 yowf quit smoking 04/22/13  with new onset sob x spring 2014 referred 05/08/2013 to pulmonary clinic by Dr Robb Matar p admit 04/21/13 with ? aecopd with hypoxemic resp failure.  History of Present Illness  05/08/2013 1st [facility] office visit/ [facility] cc indolent onset progressive doe x 6 month x more than adls such as vaccuming, yard work,  much worse since onset of cough early October rx as bronchitis with zpak prior to admit  Admit date: 04/21/2013  Discharge date: 04/24/2013 Discharge Diagnoses:  Principal Problem:  Acute respiratory failure with hypoxia  Active Problems:  Acute bronchitis  Discharge Condition: stable  Diet recommendation: heart healthy  Filed Weights    04/22/13 0538  04/23/13 0615  04/24/13 0541   Weight:  65.2 kg (143 lb 11.8 oz)  65.5 kg (144 lb 6.4 oz)  64.5 kg (142 lb 3.2 oz)   History of present illness:  62 y.o. female with history of tobacco abuse presents with complaints of worsening shortness of breath over the last 2 weeks. Patient has been short of breath even at rest and is associated wheezing. In the ER patient had required 4 L of oxygen to maintain saturation. Patient denies any chest pain. Has been having subjective feeling of fever and chills with productive cough. Denies any nausea vomiting abdominal pain or diarrhea. Patient has been a minute for acute hypoxic respiratory failure probably secondary to acute bronchitis.  Hospital Course:  Acute respiratory failure with hypoxia/ Acute bronchitis:  - treated with ab'x, inhalers and steroids  -Continue antibiotics orally.  -continue Nebs and pulmicort.  -will need outpatient PFT's and pulmonology evaluation  -smoking cessation recommended  Tobacco abuse:  - Cessation counseling provided.  - Continue nicotine patch.  Alcohol abuse:  - Cessation couseling provided.  - Continue  thiamine, folic acid and CIWA protocol. No withdrawal symptoms currently.  -Will discontinue schedule Ativan.  Chronic skin rash:  - Unchanged.  - Will need dermatology evaluation in outpatient setting (might even need skin biopsy)  Procedures:  ECHO: EF WNL   On day of ov much better, finished prednisone 05/01/13 and on spiriva 2 puffs each am and pulmicort bid per neb cc dry mouth and no longer limited from desired activities but not back to nl level of activity yet rec A = automatic = symbicort 160 Take 2 puffs first thing in am and then another 2 puffs about 12 hours later.  Plan B = Backup = Only use your albuterol as a rescue medication    06/19/2013 f/u ov/Emrah Ariola re: copd GOLD II  Chief Complaint  Patient presents with  . COPD    Breathing is unchanged. Had PFT today. Denies SOB, chest tightness, coughing or wheezing.   Doing great, no cough prefers spiriva, no saba at all , no limits to activity due to sob rec GOLD II copd = mild,  Unlikely progress significantly unless resume smoker  Plan A = automatic = spiriva one daily  - if mouth too dry first try off it to see what difference it makes and when you need more plan B and if so return here Plan B = Backup = Only use your albuterol (ventolin) as rescue    07/01/2013 acute  ov/Iona Stay re: aecopd  Chief Complaint  Patient presents with  . Acute Visit    Pt c/o cough  and congestion for the past 3 days- cough started out prod with green sputum, but is now non prod. She states that she did not sleep well last night due to SOB and had to use her inhaler x 4 last night.   Has neb and used x one and helped 7 h prior to OV and no saba hfa use on day of ov    No obvious day to day or daytime variabilty or assoc  cp or chest tightness, subjective wheeze overt sinus or hb symptoms. No unusual exp hx or h/o childhood pna/ asthma or knowledge of premature birth.  Sleeping ok without nocturnal  or early am exacerbation  of respiratory   c/o's or need for noct saba. Also denies any obvious fluctuation of symptoms with weather or environmental changes or other aggravating or alleviating factors except as outlined above   Current Medications, Allergies, Complete Past Medical History, Past Surgical History, Family History, and Social History were reviewed in [employer] record.  ROS  The following are not active complaints unless bolded sore throat, dysphagia, dental problems, itching, sneezing,  nasal congestion or excess/ purulent secretions, ear ache,   fever, chills, sweats, unintended wt loss, pleuritic or exertional cp, hemoptysis,  orthopnea pnd or leg swelling, presyncope, palpitations, heartburn, abdominal pain, anorexia, nausea, vomiting, diarrhea  or change in bowel or urinary habits, change in stools or urine, dysuria,hematuria,  rash, arthralgias, visual complaints, headache, numbness weakness or ataxia or problems with walking or coordination,  change in mood/affect or memory.              Objective:   Physical Exam  amb thin wf nad  06/19/2013      146  > 07/01/2013  145  Wt Readings from Last 3 Encounters:  05/08/13 144 lb (65.318 kg)  04/24/13 142 lb 3.2 oz (64.5 kg)  12/03/12 148 lb (67.132 kg)      HEENT mild turbinate edema.  Oropharynx no thrush or excess pnd or cobblestoning.  No JVD or cervical adenopathy. Mild accessory muscle hypertrophy. Trachea midline, nl thryroid. Chest was mildly hyperinflated by percussion with diminished breath sounds and mild increased exp time without wheeze. Hoover sign positive at late inspiration. Regular rate and rhythm without murmur gallop or rub or increase P2 or edema.  Abd: no hsm, nl excursion. Ext warm without cyanosis or clubbing.    cxr 04/21/13 No acute cardiopulmonary disease.  Mild lung base atelectasis.       Assessment & Plan:

## 2013-07-01 NOTE — Patient Instructions (Addendum)
zpak mucinex dm 1200 mg every 12 h supplement with vicodin if needed  Prednisone 10 mg take  4 each am x 2 days,   2 each am x 2 days,  1 each am x 2 days and stop   New plan A = automatic symbicort 160 Take 2 puffs first thing in am and then another 2 puffs about 12 hours later                         spiriva one capsule daily until you use it up then try off to see if you do just as well or better on just symbicort   Plan B = Backup =  Only use your albuterol as a rescue medication to be used if you can't catch your breath by resting or doing a relaxed purse lip breathing pattern.  - The less you use it, the better it will work when you need it. - Ok to use up to 2 puff every 4 hours if you must but call for immediate appointment if use goes up over your usual need - Don't leave home without it !!  (think of it like your spare tire for your car)   Plan C = backup to plan B, use albuterol neb up to every 4 hours if needed  Please schedule a follow up visit in 3 months but call sooner if needed

## 2013-07-01 NOTE — Telephone Encounter (Signed)
Spoke to pt. She wanted to be seen. Has been scheduled with MW at 3:45pm.

## 2013-07-04 NOTE — Assessment & Plan Note (Signed)
-   requested spiriva over symbicort as maint 05/31/2013  - PFT's  06/19/2013  FEV1  1.98 (71%) ratio 65 and no better B2,  DLCO 62 corrects to 86% -  06/19/2013 p extensive coaching HFA effectiveness =    90%   Now that aecopd on spiriva needs to retry symbiocrt 160 2bid  Also not understanding plan A > C already reviewed on last ov.    Each maintenance medication was reviewed in detail including most importantly the difference between maintenance and as needed and under what circumstances the prns are to be used.  Please see instructions for details which were reviewed in writing and the patient given a copy.

## 2013-10-01 ENCOUNTER — Emergency Department (HOSPITAL_COMMUNITY): Payer: BC Managed Care – PPO

## 2013-10-01 ENCOUNTER — Encounter (HOSPITAL_COMMUNITY): Payer: Self-pay | Admitting: Emergency Medicine

## 2013-10-01 ENCOUNTER — Inpatient Hospital Stay (HOSPITAL_COMMUNITY)
Admission: EM | Admit: 2013-10-01 | Discharge: 2013-10-04 | DRG: 308 | Disposition: A | Discharge status: Home / Self Care | Payer: BC Managed Care – PPO | Attending: Internal Medicine | Admitting: Internal Medicine

## 2013-10-01 DIAGNOSIS — I4891 Unspecified atrial fibrillation: Principal | ICD-10-CM | POA: Diagnosis present

## 2013-10-01 DIAGNOSIS — J189 Pneumonia, unspecified organism: Secondary | ICD-10-CM | POA: Diagnosis present

## 2013-10-01 DIAGNOSIS — J9601 Acute respiratory failure with hypoxia: Secondary | ICD-10-CM

## 2013-10-01 DIAGNOSIS — J4489 Other specified chronic obstructive pulmonary disease: Secondary | ICD-10-CM | POA: Diagnosis present

## 2013-10-01 DIAGNOSIS — J449 Chronic obstructive pulmonary disease, unspecified: Secondary | ICD-10-CM

## 2013-10-01 DIAGNOSIS — J209 Acute bronchitis, unspecified: Secondary | ICD-10-CM

## 2013-10-01 DIAGNOSIS — Z885 Allergy status to narcotic agent status: Secondary | ICD-10-CM

## 2013-10-01 DIAGNOSIS — Z801 Family history of malignant neoplasm of trachea, bronchus and lung: Secondary | ICD-10-CM

## 2013-10-01 DIAGNOSIS — F1010 Alcohol abuse, uncomplicated: Secondary | ICD-10-CM | POA: Diagnosis present

## 2013-10-01 HISTORY — DX: Paroxysmal atrial fibrillation: I48.0

## 2013-10-01 LAB — I-STAT TROPONIN, ED: TROPONIN I, POC: 0 ng/mL (ref 0.00–0.08)

## 2013-10-01 LAB — PRO B NATRIURETIC PEPTIDE: PRO B NATRI PEPTIDE: 206.1 pg/mL — AB (ref 0–125)

## 2013-10-01 LAB — CBC
HCT: 44 % (ref 36.0–46.0)
HEMOGLOBIN: 15.5 g/dL — AB (ref 12.0–15.0)
MCH: 33 pg (ref 26.0–34.0)
MCHC: 35.2 g/dL (ref 30.0–36.0)
MCV: 93.6 fL (ref 78.0–100.0)
Platelets: 427 10*3/uL — ABNORMAL HIGH (ref 150–400)
RBC: 4.7 MIL/uL (ref 3.87–5.11)
RDW: 12.8 % (ref 11.5–15.5)
WBC: 15.8 10*3/uL — ABNORMAL HIGH (ref 4.0–10.5)

## 2013-10-01 LAB — BASIC METABOLIC PANEL
BUN: 18 mg/dL (ref 6–23)
CALCIUM: 9.3 mg/dL (ref 8.4–10.5)
CHLORIDE: 96 meq/L (ref 96–112)
CO2: 20 meq/L (ref 19–32)
Creatinine, Ser: 0.81 mg/dL (ref 0.50–1.10)
GFR calc Af Amer: 88 mL/min — ABNORMAL LOW (ref >90)
GFR calc non Af Amer: 76 mL/min — ABNORMAL LOW (ref >90)
GLUCOSE: 147 mg/dL — AB (ref 70–99)
Potassium: 4.2 mEq/L (ref 3.7–5.3)
SODIUM: 136 meq/L — AB (ref 137–147)

## 2013-10-01 LAB — TROPONIN I

## 2013-10-01 MED ORDER — LORAZEPAM 1 MG PO TABS: 0.0000 mg | ORAL_TABLET | Freq: Two times a day (BID) | ORAL | Status: DC

## 2013-10-01 MED ORDER — LORAZEPAM 2 MG/ML IJ SOLN: 1.0000 mg | Freq: Four times a day (QID) | INTRAMUSCULAR | Status: DC | PRN

## 2013-10-01 MED ORDER — FOLIC ACID 1 MG PO TABS
1.0000 mg | ORAL_TABLET | Freq: Every day | ORAL | Status: DC
  Administered 2013-10-02 – 2013-10-04 (×3): 1 mg via ORAL
  Filled 2013-10-01 (×3): qty 1

## 2013-10-01 MED ORDER — THIAMINE HCL 100 MG/ML IJ SOLN
100.0000 mg | Freq: Every day | INTRAMUSCULAR | Status: DC
  Filled 2013-10-01: qty 1

## 2013-10-01 MED ORDER — DEXTROSE 5 % IV SOLN
500.0000 mg | INTRAVENOUS | Status: DC
  Administered 2013-10-01 – 2013-10-03 (×3): 500 mg via INTRAVENOUS
  Filled 2013-10-01 (×4): qty 500

## 2013-10-01 MED ORDER — OFF THE BEAT BOOK
Freq: Once | Status: AC
  Administered 2013-10-01: 23:00:00
  Filled 2013-10-01: qty 1

## 2013-10-01 MED ORDER — DEXTROSE 5 % IV SOLN: 500.0000 mg | Freq: Once | INTRAVENOUS | Status: DC

## 2013-10-01 MED ORDER — DILTIAZEM LOAD VIA INFUSION
15.0000 mg | Freq: Once | INTRAVENOUS | Status: AC
  Administered 2013-10-01: 15 mg via INTRAVENOUS
  Filled 2013-10-01: qty 15

## 2013-10-01 MED ORDER — ADULT MULTIVITAMIN W/MINERALS CH
1.0000 | ORAL_TABLET | Freq: Every day | ORAL | Status: DC
  Administered 2013-10-02 – 2013-10-04 (×3): 1 via ORAL
  Filled 2013-10-01 (×3): qty 1

## 2013-10-01 MED ORDER — ACETAMINOPHEN 650 MG RE SUPP
650.0000 mg | Freq: Four times a day (QID) | RECTAL | Status: DC | PRN
Start: 2013-10-01 — End: 2013-10-04

## 2013-10-01 MED ORDER — HEPARIN BOLUS VIA INFUSION
4000.0000 [IU] | Freq: Once | INTRAVENOUS | Status: AC
  Administered 2013-10-01: 4000 [IU] via INTRAVENOUS
  Filled 2013-10-01: qty 4000

## 2013-10-01 MED ORDER — DEXTROSE 5 % IV SOLN
1.0000 g | Freq: Once | INTRAVENOUS | Status: AC
  Administered 2013-10-01: 1 g via INTRAVENOUS
  Filled 2013-10-01: qty 10

## 2013-10-01 MED ORDER — LORAZEPAM 1 MG PO TABS
0.0000 mg | ORAL_TABLET | Freq: Four times a day (QID) | ORAL | Status: AC
  Administered 2013-10-02: 12:00:00 2 mg via ORAL
  Administered 2013-10-02: 20:00:00 1 mg via ORAL
  Filled 2013-10-01 (×2): qty 2

## 2013-10-01 MED ORDER — LORAZEPAM 1 MG PO TABS: 1.0000 mg | ORAL_TABLET | Freq: Four times a day (QID) | ORAL | Status: DC | PRN

## 2013-10-01 MED ORDER — SODIUM CHLORIDE 0.9 % IJ SOLN
3.0000 mL | Freq: Two times a day (BID) | INTRAMUSCULAR | Status: DC
  Administered 2013-10-01: 23:00:00 3 mL via INTRAVENOUS

## 2013-10-01 MED ORDER — VITAMIN B-1 100 MG PO TABS
100.0000 mg | ORAL_TABLET | Freq: Every day | ORAL | Status: DC
  Administered 2013-10-02 – 2013-10-04 (×3): 100 mg via ORAL
  Filled 2013-10-01 (×3): qty 1

## 2013-10-01 MED ORDER — HEPARIN (PORCINE) IN NACL 100-0.45 UNIT/ML-% IJ SOLN
1050.0000 [IU]/h | INTRAMUSCULAR | Status: DC
  Administered 2013-10-01 – 2013-10-02 (×4): 1000 [IU]/h via INTRAVENOUS
  Filled 2013-10-01 (×2): qty 250

## 2013-10-01 MED ORDER — ACETAMINOPHEN 325 MG PO TABS: 650.0000 mg | ORAL_TABLET | Freq: Four times a day (QID) | ORAL | Status: DC | PRN

## 2013-10-01 MED ORDER — ONDANSETRON HCL 4 MG PO TABS: 4.0000 mg | ORAL_TABLET | Freq: Four times a day (QID) | ORAL | Status: DC | PRN

## 2013-10-01 MED ORDER — ONDANSETRON HCL 4 MG/2ML IJ SOLN: 4.0000 mg | Freq: Four times a day (QID) | INTRAMUSCULAR | Status: DC | PRN

## 2013-10-01 MED ORDER — ALBUTEROL SULFATE (2.5 MG/3ML) 0.083% IN NEBU: 2.5000 mg | INHALATION_SOLUTION | Freq: Four times a day (QID) | RESPIRATORY_TRACT | Status: DC | PRN

## 2013-10-01 MED ORDER — DEXTROSE 5 % IV SOLN
5.0000 mg/h | INTRAVENOUS | Status: DC
  Administered 2013-10-01: 15 mg/h via INTRAVENOUS
  Administered 2013-10-01 – 2013-10-02 (×2): 10 mg/h via INTRAVENOUS
  Administered 2013-10-02: 21:00:00 5 mg/h via INTRAVENOUS
  Filled 2013-10-01 (×3): qty 100

## 2013-10-01 MED ORDER — BUDESONIDE-FORMOTEROL FUMARATE 160-4.5 MCG/ACT IN AERO
2.0000 | INHALATION_SPRAY | Freq: Two times a day (BID) | RESPIRATORY_TRACT | Status: DC
Start: 2013-10-01 — End: 2013-10-04
  Administered 2013-10-02 – 2013-10-03 (×2): 2 via RESPIRATORY_TRACT
  Filled 2013-10-01: qty 6

## 2013-10-01 MED ORDER — DEXTROSE 5 % IV SOLN
1.0000 g | INTRAVENOUS | Status: DC
  Administered 2013-10-02 – 2013-10-03 (×2): 1 g via INTRAVENOUS
  Filled 2013-10-01 (×3): qty 10

## 2013-10-01 NOTE — ED Notes (Signed)
Pt reports sudden onset of sob and feeling like heart is racing. HR 200 at triage.

## 2013-10-01 NOTE — Consult Note (Signed)
CARDIOLOGY CONSULT NOTE  Patient ID: Kathy RousselKathy L Frost MRN: [id number] DOB/AGE: 63-11-1950 63 y.o.  Admit date: 10/01/2013 Primary Physician Cam HaiSHAW, KIMBERLY, CNM Primary Cardiologist None Chief Complaint  Palpitations.    HPI:  The patient has no prior cardiac history.  She does have a history of chronic lung disease.  She reports that today at about 3 pm she felt her heart racing.  She had increased SOB with presyncope.  She did not have chest pain, neck or arm pain.  She did no have LOC.  She had never had these symptoms before.  She presented to the ED where she was found to have atrial fib with RVR rate in the 170s.  She did slow down with IV cardizem and she feels relatively well now.  She otherwise denies any recent cough, fevers or chills.  She has ha no PND or orthopnea.  She does her chores and walks stairs with some mild DOE that is chronic.  On CXR in the ER she is found to have a new right middle lobe infiltrate concerning for pneumonia.  She has had a "head cold" recently.  Past Medical History  Diagnosis Date  . Bronchitis   . COPD (chronic obstructive pulmonary disease)   . Shortness of breath     Past Surgical History  Procedure Laterality Date  . Cholecystectomy    . Abdominal hysterectomy    . Appendectomy      Allergies  Allergen Reactions  . Morphine And Related     Sick on stomach    Prior to Admission medications   Medication Sig Start Date End Date Taking? Authorizing Provider  albuterol (PROVENTIL HFA;VENTOLIN HFA) 108 (90 BASE) MCG/ACT inhaler Inhale 2 puffs into the lungs every 6 (six) hours as needed for wheezing. 04/24/13  Yes Marinda ElkAbraham Feliz Ortiz, MD  budesonide-formoterol [facility](SYMBICORT) 160-4.5 MCG/ACT inhaler Take 2 puffs first thing in am and then another 2 puffs about 12 hours later. 07/01/13  Yes Nyoka CowdenMichael B Wert, MD  Tetrahydrozoline HCl (VISINE OP) Apply 1-2 drops to eye daily as needed (eye allergies). For allergies   Yes Historical Provider, MD    azithromycin (ZITHROMAX) 250 MG tablet Take 250 mg by mouth See admin instructions. Take 2 on day one then 1 daily x 4 days. Finished course of medication two months ago from today (10-01-13) 07/01/13   Nyoka CowdenMichael B Wert, MD  predniSONE (DELTASONE) 10 MG tablet Take 10 mg by mouth See admin instructions. Take  4 each am x 2 days,   2 each am x 2 days,  1 each am x 2 days and stop. Completed course of medicaton back in 07-11-13 07/01/13   Nyoka CowdenMichael B Wert, MD     (Not in a hospital admission) Family History  Problem Relation Age of Onset  . Lung cancer Father     smoked    History   Social History  . Marital Status: Married    Spouse Name: N/A    Number of Children: 1  . Years of Education: N/A   Occupational History  . Clerical work    Social History Main Topics  . Smoking status: Former Smoker -- 1.00 packs/day for 45 years    Types: Cigarettes    Quit date: 04/22/2013  . Smokeless tobacco: Never Used  . Alcohol Use: 0.0 oz/week     Comment: vodka cocktail 4-5 times per wk  . Drug Use: No  . Sexual Activity: Yes   Other Topics Concern  .  Not on file   Social History Narrative  . No narrative on file     ROS:    As stated in the HPI and negative for all other systems.  Physical Exam: Blood pressure 108/93, pulse 63, resp. rate 22, SpO2 97.00%.  GENERAL:  Well appearing HEENT:  Pupils equal round and reactive, fundi not visualized, oral mucosa unremarkable NECK:  No jugular venous distention, waveform within normal limits, carotid upstroke brisk and symmetric, no bruits, no thyromegaly LYMPHATICS:  No cervical, inguinal adenopathy LUNGS:  Clear to auscultation bilaterally BACK:  No CVA tenderness CHEST:  Unremarkable HEART:  PMI not displaced or sustained,S1 and S2 within normal limits, no S3, no S4, no clicks, no rubs, no murmurs ABD:  Flat, positive bowel sounds normal in frequency in pitch, no bruits, no rebound, no guarding, no midline pulsatile mass, no hepatomegaly,  no splenomegaly EXT:  2 plus pulses throughout, no edema, no cyanosis no clubbing SKIN:  No rashes no nodules NEURO:  Cranial nerves II through XII grossly intact, motor grossly intact throughout PSYCH:  Cognitively intact, oriented to person place and time   Labs: Lab Results  Component Value Date   BUN 18 10/01/2013   Lab Results  Component Value Date   CREATININE 0.81 10/01/2013   Lab Results  Component Value Date   NA 136* 10/01/2013   K 4.2 10/01/2013   CL 96 10/01/2013   CO2 20 10/01/2013   No results found for this basename: TROPONINI   Lab Results  Component Value Date   WBC 15.8* 10/01/2013   HGB 15.5* 10/01/2013   HCT 44.0 10/01/2013   MCV 93.6 10/01/2013   PLT 427* 10/01/2013    Radiology:   CXR: New right middle lobe infiltrate, concerning for pneumonia.  EKG:  Atrial fib, rate 170s, axis WNL intervals WNL, no acute ST T wave changes.  10/01/2013   ASSESSMENT AND PLAN:   ABNORMAL CXR:  ATRIAL FIB:  Rate is improved.  I suspect that this is secondary to her acute lung process.  She was symptomatic with this and the onset seems to have been this afternoon.  Continue IV Cardizem tonight.  I will start heparin.  She might need cardioversion if she does not convert on her own.  She has a very low thromboembolic risk.  I will start heparin but she would not need long term anticoagulation.  I will order an echocardiogram  PROBABLE PNEUMONIA:  Per primary team.    Signed: Rollene Rotunda 10/01/2013, 8:04 PM

## 2013-10-01 NOTE — ED Provider Notes (Signed)
CSN: [id number]     Arrival date & time 10/01/13  1731 History   First MD Initiated Contact with Patient 10/01/13 1747     Chief Complaint  Patient presents with  . Chest Pain     (Consider location/radiation/quality/duration/timing/severity/associated sxs/prior Treatment) Patient is a 63 y.o. female presenting with shortness of breath. The history is provided by the patient.  Shortness of Breath Severity:  Moderate Onset quality:  Sudden Duration:  2 hours Timing:  Constant Progression:  Unchanged Chronicity:  New Context comment:  Pt states out of nowhere had sudden SOB. Denies chest pain. Feels lightheaded. No hx of prior. No heart dz Relieved by:  Nothing Worsened by:  Nothing tried Ineffective treatments:  None tried Associated symptoms: no abdominal pain, no chest pain, no cough, no fever, no rash, no syncope, no vomiting and no wheezing   Risk factors: tobacco use   Risk factors: no family hx of DVT, no hx of cancer, no hx of PE/DVT and no recent surgery     Past Medical History  Diagnosis Date  . Bronchitis   . COPD (chronic obstructive pulmonary disease)    Past Surgical History  Procedure Laterality Date  . Cholecystectomy    . Abdominal hysterectomy    . Appendectomy    . Nasal sinus surgery     Family History  Problem Relation Age of Onset  . Lung cancer Father     smoked  . Cirrhosis Mother   . Atrial fibrillation Brother    History  Substance Use Topics  . Smoking status: Former Smoker -- 1.00 packs/day for 45 years    Types: Cigarettes    Quit date: 04/18/2013  . Smokeless tobacco: Never Used  . Alcohol Use: 0.0 oz/week     Comment: vodka cocktail 4-5 times per wk   OB History   Grav Para Term Preterm Abortions TAB SAB Ect Mult Living                 Review of Systems  Constitutional: Negative for fever, activity change and appetite change.  HENT: Negative for congestion and rhinorrhea.   Eyes: Negative for discharge, redness and itching.   Respiratory: Positive for shortness of breath. Negative for cough and wheezing.   Cardiovascular: Negative for chest pain and syncope.  Gastrointestinal: Negative for nausea, vomiting, abdominal pain and diarrhea.  Genitourinary: Negative for hematuria and difficulty urinating.  Skin: Negative for rash.  Neurological: Positive for light-headedness. Negative for syncope.      Allergies  Morphine and related  Home Medications   No current outpatient prescriptions on file. BP 114/78  Pulse 105  Temp(Src) 98.1 F (36.7 C) (Oral)  Resp 15  Ht 5\' 8"  (1.727 m)  Wt 153 lb 10.6 oz (69.7 kg)  BMI 23.37 kg/m2  SpO2 97% Physical Exam  Constitutional: She is oriented to person, place, and time. She appears well-developed and well-nourished. No distress.  HENT:  Head: Normocephalic and atraumatic.  Mouth/Throat: Oropharynx is clear and moist.  Eyes: Conjunctivae and EOM are normal. Pupils are equal, round, and reactive to light. Right eye exhibits no discharge. Left eye exhibits no discharge. No scleral icterus.  Neck: Normal range of motion. Neck supple.  Cardiovascular: Intact distal pulses.  Exam reveals no gallop and no friction rub.   No murmur heard. irreg irreg tachy  Pulmonary/Chest: Effort normal and breath sounds normal. No respiratory distress. She has no wheezes. She has no rales.  Abdominal: Soft. She exhibits no distension and  no mass. There is no tenderness.  Musculoskeletal: Normal range of motion.  Neurological: She is alert and oriented to person, place, and time. No cranial nerve deficit. She exhibits normal muscle tone. Coordination normal.  Skin: She is not diaphoretic.    ED Course  Procedures (including critical care time) Labs Review Labs Reviewed  CBC - Abnormal; Notable for the following:    WBC 15.8 (*)    Hemoglobin 15.5 (*)    Platelets 427 (*)    All other components within normal limits  BASIC METABOLIC PANEL - Abnormal; Notable for the following:     Sodium 136 (*)    Glucose, Bld 147 (*)    GFR calc non Af Amer 76 (*)    GFR calc Af Amer 88 (*)    All other components within normal limits  PRO B NATRIURETIC PEPTIDE - Abnormal; Notable for the following:    Pro B Natriuretic peptide (BNP) 206.1 (*)    All other components within normal limits  MRSA PCR SCREENING  HEPARIN LEVEL (UNFRACTIONATED)  TSH  T3, FREE  T4, FREE  D-DIMER, QUANTITATIVE  TROPONIN I  TROPONIN I  TROPONIN I  LEGIONELLA ANTIGEN, URINE  STREP PNEUMONIAE URINARY ANTIGEN  I-STAT TROPOININ, ED   Imaging Review Dg Chest Port 1 View  10/01/2013   CLINICAL DATA:  Chest pain.  Atrial fibrillation.  History of COPD.  EXAM: PORTABLE CHEST - 1 VIEW  COMPARISON:  04/21/2013  FINDINGS: The cardiac silhouette is within normal limits for size. The lungs are hyperinflated. There is hazy opacity in the right middle lobe with silhouetting of the right heart border. Small round opacities in both lung bases likely represent nipple shadows. No pleural effusion or pneumothorax is identified. No acute osseous abnormality is seen.  IMPRESSION: New right middle lobe infiltrate, concerning for pneumonia.   Electronically Signed   By: Sebastian AcheAllen  Grady   On: 10/01/2013 17:52     EKG Interpretation None      MDM   MDM: 63 y.o.WF w/ PMHx of COPD w/ cc: of SOB, tachycardia. Sudden onset dizziness, lightheadedness, palpitations. No chest pain. No hx of similar. On arrival, Afib RVR, normo tensive, no chest pain, mentating well. Normal vitals otherwise. EKG shows Afib. Given Dilt bolus and drip with improvement to 110s-120s. Given bolus, Trop negative. Sxs improved with rate control. CXR performed shows PNA. Given Azithro and Rocephin for CAP as no recent hospitalizations. Cards evaluated, recommends medicine admission and wil follow. D/w medicine who will admit. Pt remained HDS while in ED. Sxs controlled with rate. Admit. Care of case d/w my attending.  Final diagnoses:  Atrial  fibrillation with RVR  Community acquired pneumonia   Admit   Pilar Jarvisoug Emmagene Ortner, MD 10/01/13 [phone]

## 2013-10-01 NOTE — Progress Notes (Addendum)
ANTICOAGULATION CONSULT NOTE - Initial Consult  Pharmacy Consult for Heparin  Indication: atrial fibrillation  Allergies  Allergen Reactions  . Morphine And Related     Sick on stomach     Patient Measurements:   Heparin Dosing Weight: 66 kg   Vital Signs: BP: 108/93 mmHg (03/24 2002) Pulse Rate: 63 (03/24 2002)  Labs:  Recent Labs  10/01/13 1736  HGB 15.5*  HCT 44.0  PLT 427*  CREATININE 0.81    The CrCl is unknown because both a height and weight (above a minimum accepted value) are required for this calculation.   Medical History: Past Medical History  Diagnosis Date  . Bronchitis   . COPD (chronic obstructive pulmonary disease)     Medications:   (Not in a hospital admission)  Assessment: [age] YOF with no prior cardiac history presents to the ED with atrial fib with RVR. Currently on IV cardizem and pharmacy to start IV heparin. Per cardiologist, she might need cardioversion if she does not convert on her own. She is noted to have very low thromboembolic risk and will not need long term anticoagulation. Hgb and Plt wnl.   Goal of Therapy:  Heparin level 0.3-0.7 units/ml Monitor platelets by anticoagulation protocol: Yes   Plan:  Give 4000 units bolus x 1 Start heparin infusion at 750 units/hr Check anti-Xa level in 6 hours and daily while on heparin Continue to monitor H&H and platelets  Vinnie LevelBenjamin Keyna Blizard, PharmD.  Clinical Pharmacist Pager [phone]

## 2013-10-01 NOTE — H&P (Signed)
[facility] History and Physical  Kathy Frost ZOX:[id number] DOB: 29-Oct-1950 DOA: 10/01/2013  Referring physician: ER physician. PCP: Cam Hai, CNM  Specialists: Dr. Sherene Sires.  Chief Complaint: Palpitations.  HPI: Kathy Frost is a 63 y.o. female with history of COPD and alcoholism presents to the ER because of sudden onset of palpitations since evening. In the ER patient was found to be in atrial fibrillation with RVR. Patient was started on Cardizem infusion after a bolus after which heart rate improved. Cardiologist on call was consulted and they have requested to start patient on IV heparin if in case patient needs cardioversion. Patient has been having some cough and nasal congestion over the last one week. Patient has been taking some pseudoephedrine tablets last one was 2 days ago. Chest x-ray shows possible pneumonia for which patient has been started on antibiotics. Patient otherwise denies any chest pain nausea vomiting abdominal pain fever chills diarrhea or any focal deficits.  Review of Systems: As presented in the history of presenting illness, rest negative.  Past Medical History  Diagnosis Date  . Bronchitis   . COPD (chronic obstructive pulmonary disease)    Past Surgical History  Procedure Laterality Date  . Cholecystectomy    . Abdominal hysterectomy    . Appendectomy    . Nasal sinus surgery     Social History:  reports that she quit smoking about 5 months ago. Her smoking use included Cigarettes. She has a 45 pack-year smoking history. She has never used smokeless tobacco. She reports that she drinks alcohol. She reports that she does not use illicit drugs. Where does patient live home. Can patient participate in ADLs? Yes.  Allergies  Allergen Reactions  . Morphine And Related     Sick on stomach     Family History:  Family History  Problem Relation Age of Onset  . Lung cancer Father     smoked  . Cirrhosis Mother   . Atrial fibrillation  Brother       Prior to Admission medications   Medication Sig Start Date End Date Taking? Authorizing Provider  albuterol (PROVENTIL HFA;VENTOLIN HFA) 108 (90 BASE) MCG/ACT inhaler Inhale 2 puffs into the lungs every 6 (six) hours as needed for wheezing. 04/24/13  Yes Marinda Elk, MD  budesonide-formoterol [facility]) 160-4.5 MCG/ACT inhaler Take 2 puffs first thing in am and then another 2 puffs about 12 hours later. 07/01/13  Yes Nyoka Cowden, MD  Tetrahydrozoline HCl (VISINE OP) Apply 1-2 drops to eye daily as needed (eye allergies). For allergies   Yes Historical Provider, MD  azithromycin (ZITHROMAX) 250 MG tablet Take 250 mg by mouth See admin instructions. Take 2 on day one then 1 daily x 4 days. Finished course of medication two months ago from today (10-01-13) 07/01/13   Nyoka Cowden, MD  predniSONE (DELTASONE) 10 MG tablet Take 10 mg by mouth See admin instructions. Take  4 each am x 2 days,   2 each am x 2 days,  1 each am x 2 days and stop. Completed course of medicaton back in 07-11-13 07/01/13   Nyoka Cowden, MD    Physical Exam: Filed Vitals:   10/01/13 1900 10/01/13 1915 10/01/13 1930 10/01/13 2002  BP: 95/58 100/61 107/58 108/93  Pulse: 66 33 94 63  Resp: 20 25 30 22   SpO2: 99% 96% 99% 97%     General:  Well-developed and nourished.  Eyes: Anicteric no pallor.  ENT: No discharge from the  ears eyes nose mouth.  Neck: No mass felt.  Cardiovascular: S1-S2 heard.  Respiratory: No rhonchi or crepitations.  Abdomen: Soft nontender bowel sounds present. No guarding or rigidity.  Skin: No rash.  Musculoskeletal: No edema.  Psychiatric: Appears normal.  Neurologic: Alert awake oriented to time place and person. Moves all extremities.  Labs on Admission:  Basic Metabolic Panel:  Recent Labs Lab 10/01/13 1736  NA 136*  K 4.2  CL 96  CO2 20  GLUCOSE 147*  BUN 18  CREATININE 0.81  CALCIUM 9.3   Liver Function Tests: No results found for this  basename: AST, ALT, ALKPHOS, BILITOT, PROT, ALBUMIN,  in the last 168 hours No results found for this basename: LIPASE, AMYLASE,  in the last 168 hours No results found for this basename: AMMONIA,  in the last 168 hours CBC:  Recent Labs Lab 10/01/13 1736  WBC 15.8*  HGB 15.5*  HCT 44.0  MCV 93.6  PLT 427*   Cardiac Enzymes: No results found for this basename: CKTOTAL, CKMB, CKMBINDEX, TROPONINI,  in the last 168 hours  BNP (last 3 results)  Recent Labs  04/21/13 1515 04/22/13 0514 10/01/13 1737  PROBNP 375.9* 361.4* 206.1*   CBG: No results found for this basename: GLUCAP,  in the last 168 hours  Radiological Exams on Admission: Dg Chest Port 1 View  10/01/2013   CLINICAL DATA:  Chest pain.  Atrial fibrillation.  History of COPD.  EXAM: PORTABLE CHEST - 1 VIEW  COMPARISON:  04/21/2013  FINDINGS: The cardiac silhouette is within normal limits for size. The lungs are hyperinflated. There is hazy opacity in the right middle lobe with silhouetting of the right heart border. Small round opacities in both lung bases likely represent nipple shadows. No pleural effusion or pneumothorax is identified. No acute osseous abnormality is seen.  IMPRESSION: New right middle lobe infiltrate, concerning for pneumonia.   Electronically Signed   By: Sebastian AcheAllen  Grady   On: 10/01/2013 17:52    EKG: Independently reviewed. Atrial fibrillation with RVR.  Assessment/Plan Principal Problem:   Atrial fibrillation with RVR Active Problems:   COPD GOLD II   Community acquired pneumonia   1. Atrial fibrillation with RVR - CHADSVASC score is zero. Patient presently is on heparin in anticipation of possible cardioversion if he needed as advised by cardiologist. Continue Cardizem infusion. Cardiology is planning cardioversion if patient does not spontaneously convert. 2-D echo has been ordered. Check thyroid function tests and d-dimer. Patient's atrial fibrillation probably precipitated by lung  issues. 2. Pneumonia - patient has been placed on ceftriaxone and Zithromax. Check urine Legionella and Streptococcus. Check influenza PCR. 3. COPD - presently not wheezing. 4. History of alcoholism - patient has been placed on alcohol withdrawal protocol using Ativan. Thiamine. 5. Tobacco abuse quit last November - congratulated patient.  Have reviewed patient's old charts and labs.  Code Status: Full code.  Family Communication: Patient's husband.  Disposition Plan: Admit to inpatient.    Toini Failla N. [facility] Pager [phone].  If 7PM-7AM, please contact night-coverage [email or web address] Password [email or web address] 10/01/2013, 10:05 PM

## 2013-10-02 ENCOUNTER — Encounter (HOSPITAL_COMMUNITY): Payer: Self-pay

## 2013-10-02 LAB — LEGIONELLA ANTIGEN, URINE: Legionella Antigen, Urine: NEGATIVE

## 2013-10-02 LAB — TROPONIN I
Troponin I: <0.3 ng/mL (ref <0.30)
Troponin I: <0.3 ng/mL (ref <0.30)

## 2013-10-02 LAB — CBC
HCT: 38.6 % (ref 36.0–46.0)
HEMOGLOBIN: 13.4 g/dL (ref 12.0–15.0)
MCH: 32.4 pg (ref 26.0–34.0)
MCHC: 34.7 g/dL (ref 30.0–36.0)
MCV: 93.5 fL (ref 78.0–100.0)
PLATELETS: 350 10*3/uL (ref 150–400)
RBC: 4.13 MIL/uL (ref 3.87–5.11)
RDW: 12.9 % (ref 11.5–15.5)
WBC: 11.2 10*3/uL — AB (ref 4.0–10.5)

## 2013-10-02 LAB — T3, FREE: T3, Free: 2.9 pg/mL (ref 2.3–4.2)

## 2013-10-02 LAB — T4, FREE: FREE T4: 1.03 ng/dL (ref 0.80–1.80)

## 2013-10-02 LAB — HEPARIN LEVEL (UNFRACTIONATED)
Heparin Unfractionated: 0.22 IU/mL — ABNORMAL LOW (ref 0.30–0.70)
Heparin Unfractionated: 0.33 IU/mL (ref 0.30–0.70)

## 2013-10-02 LAB — TSH: TSH: 1.4 u[IU]/mL (ref 0.350–4.500)

## 2013-10-02 LAB — INFLUENZA PANEL BY PCR (TYPE A & B)
H1N1FLUPCR: NOT DETECTED
INFLAPCR: NEGATIVE
Influenza B By PCR: NEGATIVE

## 2013-10-02 LAB — MRSA PCR SCREENING: MRSA BY PCR: NEGATIVE

## 2013-10-02 LAB — D-DIMER, QUANTITATIVE (NOT AT ARMC)

## 2013-10-02 LAB — STREP PNEUMONIAE URINARY ANTIGEN: STREP PNEUMO URINARY ANTIGEN: NEGATIVE

## 2013-10-02 MED ORDER — LORAZEPAM 1 MG PO TABS
1.0000 mg | ORAL_TABLET | Freq: Four times a day (QID) | ORAL | Status: DC | PRN
  Administered 2013-10-03: 1 mg via ORAL
  Filled 2013-10-02: qty 1

## 2013-10-02 MED ORDER — SODIUM CHLORIDE 0.9 % IV SOLN: 250.0000 mL | INTRAVENOUS | Status: DC

## 2013-10-02 MED ORDER — SODIUM CHLORIDE 0.9 % IJ SOLN: 3.0000 mL | Freq: Two times a day (BID) | INTRAMUSCULAR | Status: DC

## 2013-10-02 MED ORDER — HEPARIN BOLUS VIA INFUSION
2000.0000 [IU] | Freq: Once | INTRAVENOUS | Status: AC
  Administered 2013-10-02: 2000 [IU] via INTRAVENOUS
  Filled 2013-10-02: qty 2000

## 2013-10-02 MED ORDER — SODIUM CHLORIDE 0.9 % IJ SOLN: 3.0000 mL | INTRAMUSCULAR | Status: DC | PRN

## 2013-10-02 MED ORDER — SODIUM CHLORIDE 0.9 % IV SOLN
INTRAVENOUS | Status: DC | PRN
  Administered 2013-10-02: 10 mL/h via INTRAVENOUS

## 2013-10-02 MED ORDER — LORAZEPAM 2 MG/ML IJ SOLN: 1.0000 mg | Freq: Four times a day (QID) | INTRAMUSCULAR | Status: DC | PRN

## 2013-10-02 MED ORDER — METOPROLOL TARTRATE 25 MG PO TABS
25.0000 mg | ORAL_TABLET | Freq: Two times a day (BID) | ORAL | Status: DC
  Administered 2013-10-02 – 2013-10-04 (×5): 25 mg via ORAL
  Filled 2013-10-02 (×6): qty 1

## 2013-10-02 MED ORDER — APIXABAN 5 MG PO TABS
5.0000 mg | ORAL_TABLET | Freq: Two times a day (BID) | ORAL | Status: DC
  Administered 2013-10-02 – 2013-10-04 (×5): 5 mg via ORAL
  Filled 2013-10-02 (×7): qty 1

## 2013-10-02 NOTE — Care Management Note (Addendum)
    Page 1 of 1   10/03/2013     10:18:09 AM   CARE MANAGEMENT NOTE 10/03/2013  Patient:  Kathy Frost,Kathy Frost   Account Number:  [id number]  Date Initiated:  10/02/2013  Documentation initiated by:  HUTCHINSON,CRYSTAL  Subjective/Objective Assessment:   Admitted with A-fib     Action/Plan:   Cardioversion//benefits check for Eliquis   Anticipated DC Date:  10/04/2013   Anticipated DC Plan:  HOME/SELF CARE      DC Planning Services  CM consult      Choice offered to / List presented to:             Status of service:  In process, will continue to follow Medicare Important Message given?   (If response is "NO", the following Medicare IM given date fields will be blank) Date Medicare IM given:   Date Additional Medicare IM given:    Discharge Disposition:    Per UR Regulation:  Reviewed for med. necessity/level of care/duration of stay  If discussed at Long Length of Stay Meetings, dates discussed:    Comments:  10/03/13 0845 Oletta Cohnamellia Kimyatta Lecy, RN, BSN, NCM [phone] Spoke with pt at bedside regarding benefits check for Eliquis. Eliquis is covered, tier 2, co-pay at retail $35.00.Pt has brochure with 30 day free card and refill assistance card intact.  [facility] [address] for prescription needs.  NCM called pharmacy to confirm availability of medication.  Information relayed to pt.  Pt verbalizes importance of filling medication upon discharge.  Benefits Check: apixaban (ELIQUIS) tablet 5 mg    Oral  :  2 times daily Please f/u with provider for coverage, authorizations, co-pays and deductibles. Thanks, Northrop GrummanCrystal Hutchinson RN, BSN, [place], [place] [phone]

## 2013-10-02 NOTE — Progress Notes (Signed)
Utilization Review Completed Catheryne Deford J. Page Pucciarelli, RN, BSN, NCM [phone]  

## 2013-10-02 NOTE — Progress Notes (Addendum)
Subjective: Nose is stuffed up.  Feeling better  Objective: Vital signs in last 24 hours: Temp:  [97.9 F (36.6 C)-98.4 F (36.9 C)] 98.4 F (36.9 C) (03/25 0510) Pulse Rate:  [33-200] 95 (03/25 0510) Resp:  [14-30] 20 (03/25 0510) BP: (95-116)/(58-93) 106/68 mmHg (03/25 0510) SpO2:  [93 %-100 %] 93 % (03/25 0510) Weight:  [153 lb 10.6 oz (69.7 kg)] 153 lb 10.6 oz (69.7 kg) (03/25 0510)    Intake/Output from previous day: 03/24 0701 - 03/25 0700 In: 436.3 [P.O.:240; I.V.:196.3] Out: 200 [Urine:200] Intake/Output this shift: Total I/O In: 436.3 [P.O.:240; I.V.:196.3] Out: 200 [Urine:200]  Medications Current Facility-Administered Medications  Medication Dose Route Frequency Provider Last Rate Last Dose  . acetaminophen (TYLENOL) tablet 650 mg  650 mg Oral Q6H PRN Eduard ClosArshad N Kakrakandy, MD       Or  . acetaminophen (TYLENOL) suppository 650 mg  650 mg Rectal Q6H PRN Eduard ClosArshad N Kakrakandy, MD      . albuterol (PROVENTIL) (2.5 MG/3ML) 0.083% nebulizer solution 2.5 mg  2.5 mg Inhalation Q6H PRN Eduard ClosArshad N Kakrakandy, MD      . azithromycin (ZITHROMAX) 500 mg in dextrose 5 % 250 mL IVPB  500 mg Intravenous Q24H Eduard ClosArshad N Kakrakandy, MD   500 mg at 10/01/13 2245  . budesonide-formoterol (SYMBICORT) 160-4.5 MCG/ACT inhaler 2 puff  2 puff Inhalation BID Eduard ClosArshad N Kakrakandy, MD      . cefTRIAXone (ROCEPHIN) 1 g in dextrose 5 % 50 mL IVPB  1 g Intravenous Q24H Eduard ClosArshad N Kakrakandy, MD      . diltiazem (CARDIZEM) 100 mg in dextrose 5 % 100 mL infusion  5-15 mg/hr Intravenous Continuous Pilar Jarvisoug Brtalik, MD 10 mL/hr at 10/02/13 0100 10 mg/hr at 10/02/13 0100  . folic acid (FOLVITE) tablet 1 mg  1 mg Oral Daily Eduard ClosArshad N Kakrakandy, MD      . heparin ADULT infusion 100 units/mL (25000 units/250 mL)  1,000 Units/hr Intravenous Continuous Eduard ClosArshad N Kakrakandy, MD 10 mL/hr at 10/02/13 0450 1,000 Units/hr at 10/02/13 0450  . LORazepam (ATIVAN) tablet 1 mg  1 mg Oral Q6H PRN Eduard ClosArshad N Kakrakandy, MD        Or  . LORazepam (ATIVAN) injection 1 mg  1 mg Intravenous Q6H PRN Eduard ClosArshad N Kakrakandy, MD      . LORazepam (ATIVAN) tablet 0-4 mg  0-4 mg Oral Q6H Eduard ClosArshad N Kakrakandy, MD       Followed by  . [START ON 10/03/2013] LORazepam (ATIVAN) tablet 0-4 mg  0-4 mg Oral Q12H Eduard ClosArshad N Kakrakandy, MD      . multivitamin with minerals tablet 1 tablet  1 tablet Oral Daily Eduard ClosArshad N Kakrakandy, MD      . ondansetron [facility](ZOFRAN) tablet 4 mg  4 mg Oral Q6H PRN Eduard ClosArshad N Kakrakandy, MD       Or  . ondansetron [facility](ZOFRAN) injection 4 mg  4 mg Intravenous Q6H PRN Eduard ClosArshad N Kakrakandy, MD      . sodium chloride 0.9 % injection 3 mL  3 mL Intravenous Q12H Eduard ClosArshad N Kakrakandy, MD   3 mL at 10/01/13 2246  . sodium chloride 0.9 % injection 3 mL  3 mL Intravenous Q12H Eduard ClosArshad N Kakrakandy, MD   3 mL at 10/01/13 2245  . thiamine (VITAMIN B-1) tablet 100 mg  100 mg Oral Daily Eduard ClosArshad N Kakrakandy, MD       Or  . thiamine (B-1) injection 100 mg  100 mg Intravenous Daily Eduard ClosArshad N Kakrakandy, MD  PE: General appearance: alert, cooperative and no distress Lungs: mild crackles at bases.  No wheezing. Heart: irregularly irregular rhythm Extremities: No LEE Pulses: 2+ and symmetric Skin: Warm and dry Neurologic: Grossly normal  Lab Results:   Recent Labs  10/01/13 1736 10/02/13 0312  WBC 15.8* 11.2*  HGB 15.5* 13.4  HCT 44.0 38.6  PLT 427* 350   BMET  Recent Labs  10/01/13 1736  NA 136*  K 4.2  CL 96  CO2 20  GLUCOSE 147*  BUN 18  CREATININE 0.81  CALCIUM 9.3    Assessment/Plan   Principal Problem:   Atrial fibrillation with RVR Active Problems:   COPD GOLD II   Community acquired pneumonia  Plan:  Maintaining Afib with better rate control.  Onset was yesterday.  On IV cardizem at 5mg /hr.  IV heparin.  CHADSVASC-1.     On azithromycin, Rocephin for PNA.  BP is well controlled but there is no room to increase cardizem.  DCCV if does not convert but I would wait another day or so for the PNA to  improve.  Echo pending.   LOS: 1 day    HAGER, BRYAN PA-C 10/02/2013 6:15 AM  Patient seen, examined. Available data reviewed. Agree with findings, assessment, and plan as outlined by Wilburt Finlay, PA-C. Exam reveals an elart oriented woman in NAD, lungs clear, CV irreg and tachy no murmur, ext without edema. 2D Echo has been completed:  Left ventricle: The cavity size was normal. Systolic function was normal. The estimated ejection fraction was in the range of 55% to 60%. Wall motion was normal; there were no regional wall motion abnormalities. The transmitral flow pattern was normal. The deceleration time of the early transmitral flow velocity was normal. The pulmonary vein flow pattern was normal. The tissue Doppler parameters were normal. Left ventricular diastolic function parameters were normal.  ------------------------------------------------------------ Aortic valve: Trileaflet; normal thickness leaflets. Mobility was not restricted. Doppler: Transvalvular velocity was within the normal range. There was no stenosis. No regurgitation.  ------------------------------------------------------------ Aorta: Aortic root: The aortic root was normal in size.  ------------------------------------------------------------ Mitral valve: Structurally normal valve. Mobility was not restricted. Doppler: Transvalvular velocity was within the normal range. There was no evidence for stenosis. No regurgitation.  ------------------------------------------------------------ Left atrium: The atrium was normal in size.  ------------------------------------------------------------ Right ventricle: The cavity size was normal. Wall thickness was normal. Systolic function was normal.  ------------------------------------------------------------ Pulmonic valve: Poorly visualized. Doppler: Transvalvular velocity was within the normal range. There was no evidence for  stenosis.  ------------------------------------------------------------ Tricuspid valve: Structurally normal valve. Doppler: Transvalvular velocity was within the normal range. No significant regurgitation.  ------------------------------------------------------------ Pulmonary artery: The main pulmonary artery was normal-sized. Systolic pressure could not be accurately estimated.  ------------------------------------------------------------ Right atrium: The atrium was normal in size.  ------------------------------------------------------------ Pericardium: There was no pericardial effusion.  ------------------------------------------------------------ Systemic veins: Inferior vena cava: The vessel was normal in size.  The patient's sx's started abruptly yesterday afternoon and she was anticoagulated with heparin within 12 hours of symptom onset. Will add low-dose beta-blocker as heart rate control is suboptimal. Will tentatively put on for elective DCCV tomorrow. Reviewed risks, indication, and alternatives. Unless she gets much better rate-control with a beta-blocker, I think DCCV will be necessary.  Will transition to eliquis this pm in anticipation of DCCV tomorrow.  Tonny Bollman, M.D. 10/02/2013 10:46 AM

## 2013-10-02 NOTE — Progress Notes (Signed)
ANTICOAGULATION CONSULT NOTE Pharmacy Consult for Heparin ->Apixaban Indication: atrial fibrillation  Allergies  Allergen Reactions  . Morphine And Related     Sick on stomach     Patient Measurements: Height: 5\' 8"  (172.7 cm) Weight: 153 lb 10.6 oz (69.7 kg) IBW/kg (Calculated) : 63.9 Heparin Dosing Weight: 66 kg   Vital Signs: Temp: 97.9 F (36.6 C) (03/25 0745) Temp src: Oral (03/25 1213) BP: 103/54 mmHg (03/25 1400) Pulse Rate: 131 (03/25 1213)  Labs:  Recent Labs  10/01/13 1736 10/01/13 2314 10/02/13 0312 10/02/13 1009  HGB 15.5*  --  13.4  --   HCT 44.0  --  38.6  --   PLT 427*  --  350  --   HEPARINUNFRC  --   --  0.22* 0.33  CREATININE 0.81  --   --   --   TROPONINI  --  <0.30 <0.30 <0.30    Estimated Creatinine Clearance: 72.6 ml/min (by C-G formula based on Cr of 0.81).  Assessment: 63 yo female with new onset afib on IV heparin. Now to be transitioned to apixaban with plans for DCCV tomorrow. CBC stable, No bleeding noted.  SCr 0.81, est CrCl 73 ml/min.  Goal of Therapy:  Full anticoagulation Monitor platelets by anticoagulation protocol: Yes   Plan:  1) D/c heparin 2) Apixaban 5mg  po BID - first dose now so that pt will receive 2 doses today 3) Will f/u CBC and signs/symptoms of bleeding 4) Case management consult for new Apixaban  Christoper Fabianaron Cera Rorke, PharmD, BCPS Clinical pharmacist, pager [phone] 10/02/2013  2:42 PM

## 2013-10-02 NOTE — Progress Notes (Addendum)
ANTICOAGULATION CONSULT NOTE Pharmacy Consult for Heparin  Indication: atrial fibrillation  Allergies  Allergen Reactions  . Morphine And Related     Sick on stomach     Patient Measurements: Height: 5\' 8"  (172.7 cm) Weight: 153 lb 10.6 oz (69.7 kg) IBW/kg (Calculated) : 63.9 Heparin Dosing Weight: 66 kg   Vital Signs: Temp: 97.9 F (36.6 C) (03/24 2347) Temp src: Oral (03/24 2347) BP: 106/65 mmHg (03/24 2347) Pulse Rate: 87 (03/24 2347)  Labs:  Recent Labs  10/01/13 1736 10/01/13 2314 10/02/13 0312  HGB 15.5*  --  13.4  HCT 44.0  --  38.6  PLT 427*  --  350  HEPARINUNFRC  --   --  0.22*  CREATININE 0.81  --   --   TROPONINI  --  <0.30  --     Estimated Creatinine Clearance: 72.6 ml/min (by C-G formula based on Cr of 0.81).  Assessment: 63 yo female with Afib for heparin  Goal of Therapy:  Heparin level 0.3-0.7 units/ml Monitor platelets by anticoagulation protocol: Yes   Plan:  Heparin 2000 units IV bolus, then increase heparin 1000 units/hr Check heparin level in 6 hours.   Geannie RisenGreg Barak Bialecki, PharmD, BCPS

## 2013-10-02 NOTE — Progress Notes (Signed)
[facility] PROGRESS NOTE  LANNETTE AVELLINO ZOX:[id number] DOB: September 13, 1950 DOA: 10/01/2013 PCP: Cam Hai, CNM  Assessment/Plan: Principal Problem:  Atrial fibrillation with RVR  Active Problems:  COPD GOLD II  Community acquired pneumonia  63 y.o. female with history of COPD and alcoholism presents to the ER because of sudden onset of palpitations found to have PNA, A fib  1. Atrial fibrillation with RVR  -improving on IV Cardizem, on IV heparin; pend echo; ? Need PO AV block agent; defer management to cardiology   2. CAP, CXR: New right middle lobe infiltrate -afebrile; cont ATX, bronchodilators prn oxygen   3. COPD stable; cont bronchodilators   4. History of alcoholism  -no s/s of withdrawal;  patient has been placed on alcohol withdrawal protocol using Ativan. Thiamine  Code Status: full Family Communication: d/w patient, her husband  (indicate person spoken with, relationship, and if by phone, the number) Disposition Plan: home pend clinical improvement    Consultants:  cardiology   Procedures:  Echo pend   Antibiotics:  Ceftriaxone azythro 3/25<<<<<   (indicate start date, and stop date if known)  HPI/Subjective: alert  Objective: Filed Vitals:   10/02/13 0745  BP: 120/74  Pulse: 88  Temp: 97.9 F (36.6 C)  Resp: 18    Intake/Output Summary (Last 24 hours) at 10/02/13 1102 Last data filed at 10/02/13 8119  Gross per 24 hour  Intake 894.25 ml  Output    400 ml  Net 494.25 ml   Filed Weights   10/01/13 2215 10/02/13 0510  Weight: 69.7 kg (153 lb 10.6 oz) 69.7 kg (153 lb 10.6 oz)    Exam:   General:  alert  Cardiovascular: s1,s2 rrr  Respiratory: few rales   Abdomen: soft, nt,nd  Musculoskeletal: no LE edema   Data Reviewed: Basic Metabolic Panel:  Recent Labs Lab 10/01/13 1736  NA 136*  K 4.2  CL 96  CO2 20  GLUCOSE 147*  BUN 18  CREATININE 0.81  CALCIUM 9.3   Liver Function Tests: No results found for this  basename: AST, ALT, ALKPHOS, BILITOT, PROT, ALBUMIN,  in the last 168 hours No results found for this basename: LIPASE, AMYLASE,  in the last 168 hours No results found for this basename: AMMONIA,  in the last 168 hours CBC:  Recent Labs Lab 10/01/13 1736 10/02/13 0312  WBC 15.8* 11.2*  HGB 15.5* 13.4  HCT 44.0 38.6  MCV 93.6 93.5  PLT 427* 350   Cardiac Enzymes:  Recent Labs Lab 10/01/13 2314 10/02/13 0312  TROPONINI <0.30 <0.30   BNP (last 3 results)  Recent Labs  04/21/13 1515 04/22/13 0514 10/01/13 1737  PROBNP 375.9* 361.4* 206.1*   CBG: No results found for this basename: GLUCAP,  in the last 168 hours  Recent Results (from the past 240 hour(s))  MRSA PCR SCREENING     Status: None   Collection Time    10/01/13 10:10 PM      Result Value Ref Range Status   MRSA by PCR NEGATIVE  NEGATIVE Final   Comment:            The GeneXpert MRSA Assay (FDA     approved for NASAL specimens     only), is one component of a     comprehensive MRSA colonization     surveillance program. It is not     intended to diagnose MRSA     infection nor to guide or     monitor treatment for  MRSA infections.     Studies: Dg Chest Port 1 View  10/01/2013   CLINICAL DATA:  Chest pain.  Atrial fibrillation.  History of COPD.  EXAM: PORTABLE CHEST - 1 VIEW  COMPARISON:  04/21/2013  FINDINGS: The cardiac silhouette is within normal limits for size. The lungs are hyperinflated. There is hazy opacity in the right middle lobe with silhouetting of the right heart border. Small round opacities in both lung bases likely represent nipple shadows. No pleural effusion or pneumothorax is identified. No acute osseous abnormality is seen.  IMPRESSION: New right middle lobe infiltrate, concerning for pneumonia.   Electronically Signed   By: Sebastian AcheAllen  Grady   On: 10/01/2013 17:52    Scheduled Meds: . azithromycin  500 mg Intravenous Q24H  . budesonide-formoterol  2 puff Inhalation BID  .  cefTRIAXone (ROCEPHIN)  IV  1 g Intravenous Q24H  . folic acid  1 mg Oral Daily  . LORazepam  0-4 mg Oral Q6H   Followed by  . [START ON 10/03/2013] LORazepam  0-4 mg Oral Q12H  . multivitamin with minerals  1 tablet Oral Daily  . sodium chloride  3 mL Intravenous Q12H  . sodium chloride  3 mL Intravenous Q12H  . thiamine  100 mg Oral Daily   Or  . thiamine  100 mg Intravenous Daily   Continuous Infusions: . diltiazem (CARDIZEM) infusion 5 mg/hr (10/02/13 0600)  . heparin 1,000 Units/hr (10/02/13 0700)    Principal Problem:   Atrial fibrillation with RVR Active Problems:   COPD GOLD II   Community acquired pneumonia    Time spent: >35 minutes     Esperanza SheetsBURIEV, Avneet Ashmore N  [facility] Pager [phone]. If 7PM-7AM, please contact night-coverage at [email or web address], password [email or web address] 10/02/2013, 11:02 AM  LOS: 1 day

## 2013-10-02 NOTE — Progress Notes (Signed)
ANTICOAGULATION CONSULT NOTE Pharmacy Consult for Heparin  Indication: atrial fibrillation  Allergies  Allergen Reactions  . Morphine And Related     Sick on stomach     Patient Measurements: Height: 5\' 8"  (172.7 cm) Weight: 153 lb 10.6 oz (69.7 kg) IBW/kg (Calculated) : 63.9 Heparin Dosing Weight: 66 kg   Vital Signs: Temp: 97.9 F (36.6 C) (03/25 0745) Temp src: Oral (03/25 1213) BP: 105/70 mmHg (03/25 1213) Pulse Rate: 131 (03/25 1213)  Labs:  Recent Labs  10/01/13 1736 10/01/13 2314 10/02/13 0312 10/02/13 1009  HGB 15.5*  --  13.4  --   HCT 44.0  --  38.6  --   PLT 427*  --  350  --   HEPARINUNFRC  --   --  0.22* 0.33  CREATININE 0.81  --   --   --   TROPONINI  --  <0.30 <0.30 <0.30    Estimated Creatinine Clearance: 72.6 ml/min (by C-G formula based on Cr of 0.81).  Assessment: 63 yo female with new on set Afib on IV heparin, heparin level low-end therapeutic on 1000 units/hr. CBC stable, No bleeding noted per chart. Also on IV cardizem, may try DCCV if does not convert.   Goal of Therapy:  Heparin level 0.3-0.7 units/ml Monitor platelets by anticoagulation protocol: Yes   Plan:  increase heparin slightly to 1050 units/hr Check heparin level in 6 hours to confirm  Bayard HuggerMei Yessica Putnam, PharmD, BCPS  Clinical Pharmacist  Pager: [phone]

## 2013-10-03 ENCOUNTER — Encounter (HOSPITAL_COMMUNITY): Payer: Self-pay | Admitting: Certified Registered"

## 2013-10-03 ENCOUNTER — Inpatient Hospital Stay (HOSPITAL_COMMUNITY): Payer: BC Managed Care – PPO | Admitting: Anesthesiology

## 2013-10-03 ENCOUNTER — Encounter (HOSPITAL_COMMUNITY): Payer: BC Managed Care – PPO | Admitting: Anesthesiology

## 2013-10-03 ENCOUNTER — Encounter (HOSPITAL_COMMUNITY)
Admission: EM | Disposition: A | Discharge status: Home / Self Care | Payer: Self-pay | Source: Home / Self Care | Attending: Internal Medicine

## 2013-10-03 DIAGNOSIS — I059 Rheumatic mitral valve disease, unspecified: Secondary | ICD-10-CM

## 2013-10-03 HISTORY — PX: CARDIOVERSION: SHX1299

## 2013-10-03 LAB — CBC
HEMATOCRIT: 41.1 % (ref 36.0–46.0)
HEMOGLOBIN: 14.1 g/dL (ref 12.0–15.0)
MCH: 32.3 pg (ref 26.0–34.0)
MCHC: 34.3 g/dL (ref 30.0–36.0)
MCV: 94.3 fL (ref 78.0–100.0)
Platelets: 360 10*3/uL (ref 150–400)
RBC: 4.36 MIL/uL (ref 3.87–5.11)
RDW: 13.1 % (ref 11.5–15.5)
WBC: 8.5 10*3/uL (ref 4.0–10.5)

## 2013-10-03 SURGERY — CARDIOVERSION
Anesthesia: General

## 2013-10-03 MED ORDER — DILTIAZEM HCL ER COATED BEADS 240 MG PO CP24
240.0000 mg | ORAL_CAPSULE | Freq: Every day | ORAL | Status: DC
  Administered 2013-10-03 – 2013-10-04 (×2): 240 mg via ORAL
  Filled 2013-10-03 (×2): qty 1

## 2013-10-03 MED ORDER — LIDOCAINE HCL (CARDIAC) 20 MG/ML IV SOLN
INTRAVENOUS | Status: DC | PRN
  Administered 2013-10-03: 40 mg via INTRAVENOUS

## 2013-10-03 MED ORDER — DILTIAZEM HCL 100 MG IV SOLR
5.0000 mg/h | INTRAVENOUS | Status: AC
  Filled 2013-10-03: qty 100

## 2013-10-03 MED ORDER — PROPOFOL 10 MG/ML IV BOLUS
INTRAVENOUS | Status: DC | PRN
  Administered 2013-10-03: 20 mg via INTRAVENOUS
  Administered 2013-10-03: 60 mg via INTRAVENOUS

## 2013-10-03 NOTE — Progress Notes (Signed)
Paged to [id number] for stand-by-Cardioversion initially@0800  for 0900, staff made aware of my arrival.

## 2013-10-03 NOTE — CV Procedure (Signed)
   Cardioversion Procedure Note  Name: Kathy Frost MRN: [id number] DOB: 05-08-1951  Procedure: Elective cardioversion  Indication: Atrial fibrillation with RVR  Procedural details: Risks, indications, and alternatives to elective cardioversion have been reviewed with the patient. Anesthesia administered 80 mg propofol and 40 mg lidocaine intravenously. The patient underwent one synchronized shock biphasic with 120 J, converting from atrial fib with RVR to sinus rhythm at a heart rate of 80 beats per minute. There were no immediate procedural complications. She tolerated the procedure well. Will transition her from IV to oral diltiazem later today. Would anticipate discharge tomorrow morning if she remains in sinus rhythm.  Tonny BollmanMichael Phoua Hoadley 10/03/2013, 9:21 AM

## 2013-10-03 NOTE — Anesthesia Preprocedure Evaluation (Addendum)
Anesthesia Evaluation  Patient identified by MRN, date of birth, ID band Patient awake    Reviewed: Allergy & Precautions, H&P , NPO status , Patient's Chart, lab work & pertinent test results  Airway Mallampati: II TM Distance: >3 FB Neck ROM: Full    Dental  (+) Teeth Intact, Dental Advisory Given   Pulmonary COPDformer smoker,  breath sounds clear to auscultation        Cardiovascular + dysrhythmias Atrial Fibrillation Rhythm:Irregular Rate:Normal     Neuro/Psych    GI/Hepatic   Endo/Other    Renal/GU      Musculoskeletal   Abdominal   Peds  Hematology   Anesthesia Other Findings   Reproductive/Obstetrics                         Anesthesia Physical Anesthesia Plan  ASA: III  Anesthesia Plan: General   Post-op Pain Management:    Induction: Intravenous  Airway Management Planned: Mask  Additional Equipment:   Intra-op Plan:   Post-operative Plan:   Informed Consent: I have reviewed the patients History and Physical, chart, labs and discussed the procedure including the risks, benefits and alternatives for the proposed anesthesia with the patient or authorized representative who has indicated his/her understanding and acceptance.   Dental advisory given  Plan Discussed with: Anesthesiologist and Surgeon  Anesthesia Plan Comments:         Anesthesia Quick Evaluation

## 2013-10-03 NOTE — Discharge Instructions (Addendum)
Information on my medicine - ELIQUIS® (apixaban) ° °This medication education was reviewed with me or my healthcare representative as part of my discharge preparation.  The pharmacist that spoke with me during my hospital stay was: °Why was Eliquis® prescribed for you? °Eliquis® was prescribed for you to reduce the risk of a blood clot forming that can cause a stroke if you have a medical condition called atrial fibrillation (a type of irregular heartbeat). ° °What do You need to know about Eliquis® ? °Take your Eliquis® 5mg TWICE DAILY - one tablet in the morning and one tablet in the evening with or without food. If you have difficulty swallowing the tablet whole please discuss with your pharmacist how to take the medication safely. ° °Take Eliquis® exactly as prescribed by your doctor and DO NOT stop taking Eliquis® without talking to the doctor who prescribed the medication.  Stopping may increase your risk of developing a stroke.  Refill your prescription before you run out. ° °After discharge, you should have regular check-up appointments with your healthcare provider that is prescribing your Eliquis®.  In the future your dose may need to be changed if your kidney function or weight changes by a significant amount or as you get older. ° °What do you do if you miss a dose? °If you miss a dose, take it as soon as you remember on the same day and resume taking twice daily.  Do not take more than one dose of ELIQUIS at the same time to make up a missed dose. ° °Important Safety Information °A possible side effect of Eliquis® is bleeding. You should call your healthcare provider right away if you experience any of the following: °  Bleeding from an injury or your nose that does not stop. °  Unusual colored urine (red or dark brown) or unusual colored stools (red or black). °  Unusual bruising for unknown reasons. °  A serious fall or if you hit your head (even if there is no bleeding). ° °Some medicines may  interact with Eliquis® and might increase your risk of bleeding or clotting while on Eliquis®. To help avoid this, consult your healthcare provider or pharmacist prior to using any new prescription or non-prescription medications, including herbals, vitamins, non-steroidal anti-inflammatory drugs (NSAIDs) and supplements. ° °This website has more information on Eliquis® (apixaban): [email or web address].com. ° °

## 2013-10-03 NOTE — Transfer of Care (Signed)
Immediate Anesthesia Transfer of Care Note  Patient: Bonney RousselKathy L Clune  Procedure(s) Performed: Procedure(s): CARDIOVERSION    (BEDSIDE)  (N/A)  Patient Location: Nursing Unit  Anesthesia Type:General  Level of Consciousness: awake, alert , oriented and patient cooperative  Airway & Oxygen Therapy: Patient Spontanous Breathing and Patient connected to nasal cannula oxygen  Post-op Assessment: Report given to PACU RN and Post -op Vital signs reviewed and stable  Post vital signs: Reviewed and stable  Complications: No apparent anesthesia complications

## 2013-10-03 NOTE — Progress Notes (Signed)
Cardioversion complete-NSR with no complications, CRNA remains @ bedside, RN made aware RT leaving floor.

## 2013-10-03 NOTE — Progress Notes (Signed)
    Subjective:  No chest pain or shortness of breath. Continued palpitations.  Objective:  Vital Signs in the last 24 hours: Temp:  [97.7 F (36.5 C)-98.1 F (36.7 C)] 98.1 F (36.7 C) (03/26 0600) Pulse Rate:  [78-131] 109 (03/26 0600) Resp:  [16-20] 20 (03/26 0600) BP: (90-120)/(54-80) 107/75 mmHg (03/26 0600) SpO2:  [96 %-98 %] 98 % (03/26 0600) Weight:  [70 kg (154 lb 5.2 oz)] 70 kg (154 lb 5.2 oz) (03/26 0014)  Intake/Output from previous day: 03/25 0701 - 03/26 0700 In: 1010 [P.O.:1000; I.V.:10] Out: 200 [Urine:200]  Physical Exam: Pt is alert and oriented, NAD HEENT: normal Neck: JVP - normal Lungs: CTA bilaterally CV: tachy and irregular Abd: soft, NT, Positive BS, no hepatomegaly Ext: no C/C/E, distal pulses intact and equal Skin: warm/dry no rash   Lab Results:  Recent Labs  10/02/13 0312 10/03/13 0419  WBC 11.2* 8.5  HGB 13.4 14.1  PLT 350 360    Recent Labs  10/01/13 1736  NA 136*  K 4.2  CL 96  CO2 20  GLUCOSE 147*  BUN 18  CREATININE 0.81    Recent Labs  10/02/13 0312 10/02/13 1009  TROPONINI <0.30 <0.30    Cardiac Studies: none  Tele: Atrial fibrillation  Assessment/Plan:  Atrial fibrillation with RVR. Normal LV function by echo October 2014. Pt had abrupt onset of tachypalpitations 3/24 and was anticoagulated immediately on arrival. We are within a 48 hour window. Now on eliquis to receive 3rd dose this am prior to DCCV. I have reviewed the risks, indications, and alternatives to elective cardioversion. She understands and agrees to proceed. Will transition off of IV dilt after cardioversion today.   Tonny BollmanMichael Vidal Lampkins, M.D. 10/03/2013, 7:24 AM

## 2013-10-03 NOTE — Anesthesia Postprocedure Evaluation (Signed)
  Anesthesia Post-op Note  Patient: Bonney RousselKathy L Sonneborn  Procedure(s) Performed: Procedure(s): CARDIOVERSION    (BEDSIDE)  (N/A)  Patient Location: PACU  Anesthesia Type:General  Level of Consciousness: awake and alert   Airway and Oxygen Therapy: Patient Spontanous Breathing  Post-op Pain: none  Post-op Assessment: Post-op Vital signs reviewed, Patient's Cardiovascular Status Stable and Respiratory Function Stable  Post-op Vital Signs: Reviewed  Filed Vitals:   10/03/13 0756  BP: 128/60  Pulse: 118  Temp: 36.6 C  Resp: 20    Complications: No apparent anesthesia complications

## 2013-10-04 ENCOUNTER — Encounter (HOSPITAL_COMMUNITY): Payer: Self-pay | Admitting: Nurse Practitioner

## 2013-10-04 LAB — CBC
HEMATOCRIT: 40.8 % (ref 36.0–46.0)
HEMOGLOBIN: 14 g/dL (ref 12.0–15.0)
MCH: 32.1 pg (ref 26.0–34.0)
MCHC: 34.3 g/dL (ref 30.0–36.0)
MCV: 93.6 fL (ref 78.0–100.0)
Platelets: 321 10*3/uL (ref 150–400)
RBC: 4.36 MIL/uL (ref 3.87–5.11)
RDW: 12.8 % (ref 11.5–15.5)
WBC: 8.4 10*3/uL (ref 4.0–10.5)

## 2013-10-04 LAB — BASIC METABOLIC PANEL
BUN: 10 mg/dL (ref 6–23)
CHLORIDE: 99 meq/L (ref 96–112)
CO2: 22 meq/L (ref 19–32)
Calcium: 9.2 mg/dL (ref 8.4–10.5)
Creatinine, Ser: 0.53 mg/dL (ref 0.50–1.10)
GFR calc Af Amer: >90 mL/min (ref >90)
GFR calc non Af Amer: >90 mL/min (ref >90)
GLUCOSE: 109 mg/dL — AB (ref 70–99)
POTASSIUM: 4.2 meq/L (ref 3.7–5.3)
Sodium: 137 mEq/L (ref 137–147)

## 2013-10-04 LAB — MAGNESIUM: Magnesium: 1.9 mg/dL (ref 1.5–2.5)

## 2013-10-04 MED ORDER — LEVOFLOXACIN 500 MG PO TABS: 500.0000 mg | ORAL_TABLET | Freq: Every day | ORAL | Status: DC

## 2013-10-04 MED ORDER — APIXABAN 5 MG PO TABS: 5.0000 mg | ORAL_TABLET | Freq: Two times a day (BID) | ORAL | Status: DC

## 2013-10-04 MED ORDER — DILTIAZEM HCL ER COATED BEADS 240 MG PO CP24: 240.0000 mg | ORAL_CAPSULE | Freq: Every day | ORAL | Status: DC

## 2013-10-04 MED ORDER — METOPROLOL TARTRATE 25 MG PO TABS: 25.0000 mg | ORAL_TABLET | Freq: Two times a day (BID) | ORAL | Status: DC

## 2013-10-04 NOTE — ED Provider Notes (Signed)
I saw and evaluated the patient, reviewed the resident's note and I agree with the findings and plan.   .Face to face Exam:  General:  Awake HEENT:  Atraumatic Resp:  Normal effort Abd:  Nondistended Neuro:No focal weakness   EKG reviewed with resident  CRITICAL CARE Performed by: Nelva NayBEATON,Antwine Agosto L Total critical care time: 45 min Critical care time was exclusive of separately billable procedures and treating other patients. Critical care was necessary to treat or prevent imminent or life-threatening deterioration. Critical care was time spent personally by me on the following activities: development of treatment plan with patient and/or surrogate as well as nursing, discussions with consultants, evaluation of patient's response to treatment, examination of patient, obtaining history from patient or surrogate, ordering and performing treatments and interventions, ordering and review of laboratory studies, ordering and review of radiographic studies, pulse oximetry and re-evaluation of patient's condition.   Nelia Shiobert L Latese Dufault, MD 10/04/13 1030

## 2013-10-04 NOTE — Discharge Summary (Signed)
Physician Discharge Summary  Bonney RousselKathy L Frost FAO:[id number] DOB: March 06, 1951 DOA: 10/01/2013  PCP: Cam HaiSHAW, KIMBERLY, CNM  Admit date: 10/01/2013 Discharge date: 10/04/2013  Time spent: >35 minutes  Recommendations for Outpatient Follow-up:  F/u with cardiology as scheduled F/u with PCP in 1-2 weeks  Discharge Diagnoses:  Principal Problem:   Atrial fibrillation with RVR Active Problems:   COPD GOLD II   Community acquired pneumonia   Discharge Condition: stable   Diet recommendation: heart healthy   Filed Weights   10/02/13 0510 10/03/13 0014 10/04/13 0006  Weight: 69.7 kg (153 lb 10.6 oz) 70 kg (154 lb 5.2 oz) 70.5 kg (155 lb 6.8 oz)    History of present illness:  Principal Problem:  Atrial fibrillation with RVR  Active Problems:  COPD GOLD II  Community acquired pneumonia  63 y.o. female with history of COPD and alcoholism presents to the ER because of sudden onset of palpitations found to have PNA, A fib    Hospital Course:  1. Atrial fibrillation with RVR  -s/p cardioversion currently in NSR; anticoagulation for 4 week per cardiology; HR stable on BB, cardizem;  -outpatient follow up scheduled by cards  2. CAP, CXR: New right middle lobe infiltrate  -afebrile; improved on ATX, bronchodilators prn oxygen  3. COPD stable; cont bronchodilators  4. History of alcoholism  -no s/s of withdrawal;    Procedures:  Cardioversion  (i.e. Studies not automatically included, echos, thoracentesis, etc; not x-rays)  Consultations:  Cardiology   Discharge Exam: Filed Vitals:   10/04/13 0730  BP: 101/42  Pulse: 63  Temp: 97.5 F (36.4 C)  Resp: 18    General: alert Cardiovascular: s1,s2 rrr Respiratory: CTA BL  Discharge Instructions  Discharge Orders   Future Appointments Provider Department Dept Phone   10/22/2013 10:30 AM Rollene RotundaJames Hochrein, MD [facility] [phone]   Future Orders Complete By Expires   Diet - low sodium heart healthy  As directed     Discharge instructions  As directed    Comments:     Please follow up with cardiology outpatient 2 week s   Increase activity slowly  As directed        Medication List    STOP taking these medications       azithromycin 250 MG tablet  Commonly known as:  ZITHROMAX      TAKE these medications       albuterol 108 (90 BASE) MCG/ACT inhaler  Commonly known as:  PROVENTIL HFA;VENTOLIN HFA  Inhale 2 puffs into the lungs every 6 (six) hours as needed for wheezing.     apixaban 5 MG Tabs tablet  Commonly known as:  ELIQUIS  Take 1 tablet (5 mg total) by mouth 2 (two) times daily.     budesonide-formoterol 160-4.5 MCG/ACT inhaler  Commonly known as:  SYMBICORT  Take 2 puffs first thing in am and then another 2 puffs about 12 hours later.     diltiazem 240 MG 24 hr capsule  Commonly known as:  CARDIZEM CD  Take 1 capsule (240 mg total) by mouth daily.     levofloxacin 500 MG tablet  Commonly known as:  LEVAQUIN  Take 1 tablet (500 mg total) by mouth daily.     metoprolol tartrate 25 MG tablet  Commonly known as:  LOPRESSOR  Take 1 tablet (25 mg total) by mouth 2 (two) times daily.     predniSONE 10 MG tablet  Commonly known as:  DELTASONE  Take 10 mg by  mouth See admin instructions. Take  4 each am x 2 days,   2 each am x 2 days,  1 each am x 2 days and stop. Completed course of medicaton back in 07-11-13     VISINE OP  Apply 1-2 drops to eye daily as needed (eye allergies). For allergies       Allergies  Allergen Reactions  . Morphine And Related     Sick on stomach        Follow-up Information   Follow up with Rollene Rotunda, MD On 10/22/2013. (10:30 AM)    Contact information:   [address] [place] [address] [phone]      Follow up with Cam Hai, CNM In 1 week.   Specialty:  Obstetrics and Gynecology   Contact information:   [address] [place] [address] [place] [address] [phone]        The results of significant  diagnostics from this hospitalization (including imaging, microbiology, ancillary and laboratory) are listed below for reference.    Significant Diagnostic Studies: Dg Chest Port 1 View  10/01/2013   CLINICAL DATA:  Chest pain.  Atrial fibrillation.  History of COPD.  EXAM: PORTABLE CHEST - 1 VIEW  COMPARISON:  04/21/2013  FINDINGS: The cardiac silhouette is within normal limits for size. The lungs are hyperinflated. There is hazy opacity in the right middle lobe with silhouetting of the right heart border. Small round opacities in both lung bases likely represent nipple shadows. No pleural effusion or pneumothorax is identified. No acute osseous abnormality is seen.  IMPRESSION: New right middle lobe infiltrate, concerning for pneumonia.   Electronically Signed   By: Sebastian Ache   On: 10/01/2013 17:52    Microbiology: Recent Results (from the past 240 hour(s))  MRSA PCR SCREENING     Status: None   Collection Time    10/01/13 10:10 PM      Result Value Ref Range Status   MRSA by PCR NEGATIVE  NEGATIVE Final   Comment:            The GeneXpert MRSA Assay (FDA     approved for NASAL specimens     only), is one component of a     comprehensive MRSA colonization     surveillance program. It is not     intended to diagnose MRSA     infection nor to guide or     monitor treatment for     MRSA infections.     Labs: Basic Metabolic Panel:  Recent Labs Lab 10/01/13 1736 10/04/13 1015  NA 136* 137  K 4.2 4.2  CL 96 99  CO2 20 22  GLUCOSE 147* 109*  BUN 18 10  CREATININE 0.81 0.53  CALCIUM 9.3 9.2  MG  --  1.9   Liver Function Tests: No results found for this basename: AST, ALT, ALKPHOS, BILITOT, PROT, ALBUMIN,  in the last 168 hours No results found for this basename: LIPASE, AMYLASE,  in the last 168 hours No results found for this basename: AMMONIA,  in the last 168 hours CBC:  Recent Labs Lab 10/01/13 1736 10/02/13 0312 10/03/13 0419 10/04/13 0347  WBC 15.8* 11.2*  8.5 8.4  HGB 15.5* 13.4 14.1 14.0  HCT 44.0 38.6 41.1 40.8  MCV 93.6 93.5 94.3 93.6  PLT 427* 350 360 321   Cardiac Enzymes:  Recent Labs Lab 10/01/13 2314 10/02/13 0312 10/02/13 1009  TROPONINI <0.30 <0.30 <0.30   BNP: BNP (last 3  results)  Recent Labs  04/21/13 1515 04/22/13 0514 10/01/13 1737  PROBNP 375.9* 361.4* 206.1*   CBG: No results found for this basename: GLUCAP,  in the last 168 hours     Signed:  Jonette Mate N  [facility] 10/04/2013, 11:28 AM

## 2013-10-04 NOTE — Progress Notes (Signed)
[facility] PROGRESS NOTE  Kathy FERDINAND ZOX:[id number] DOB: 16-Sep-1950 DOA: 10/01/2013 PCP: Cam Hai, CNM  Assessment/Plan: Principal Problem:  Atrial fibrillation with RVR  Active Problems:  COPD GOLD II  Community acquired pneumonia  63 y.o. female with history of COPD and alcoholism presents to the ER because of sudden onset of palpitations found to have PNA, A fib  1. Atrial fibrillation with RVR  -improving on IV Cardizem, on IV heparin; pend echo; ? Need PO AV block agent; defer management to cardiology   2. CAP, CXR: New right middle lobe infiltrate -afebrile; cont ATX, bronchodilators prn oxygen   3. COPD stable; cont bronchodilators   4. History of alcoholism  -no s/s of withdrawal;  patient has been placed on alcohol withdrawal protocol using Ativan. Thiamine  Code Status: full Family Communication: d/w patient, her husband  (indicate person spoken with, relationship, and if by phone, the number) Disposition Plan: home pend clinical improvement    Consultants:  cardiology   Procedures:  Echo pend   Antibiotics:  Ceftriaxone azythro 3/25<<<<<   (indicate start date, and stop date if known)  HPI/Subjective: alert  Objective: Filed Vitals:   10/04/13 0730  BP: 101/42  Pulse: 63  Temp: 97.5 F (36.4 C)  Resp: 18    Intake/Output Summary (Last 24 hours) at 10/04/13 1125 Last data filed at 10/03/13 2221  Gross per 24 hour  Intake    420 ml  Output      0 ml  Net    420 ml   Filed Weights   10/02/13 0510 10/03/13 0014 10/04/13 0006  Weight: 69.7 kg (153 lb 10.6 oz) 70 kg (154 lb 5.2 oz) 70.5 kg (155 lb 6.8 oz)    Exam:   General:  alert  Cardiovascular: s1,s2 rrr  Respiratory: few rales   Abdomen: soft, nt,nd  Musculoskeletal: no LE edema   Data Reviewed: Basic Metabolic Panel:  Recent Labs Lab 10/01/13 1736 10/04/13 1015  NA 136* 137  K 4.2 4.2  CL 96 99  CO2 20 22  GLUCOSE 147* 109*  BUN 18 10  CREATININE  0.81 0.53  CALCIUM 9.3 9.2  MG  --  1.9   Liver Function Tests: No results found for this basename: AST, ALT, ALKPHOS, BILITOT, PROT, ALBUMIN,  in the last 168 hours No results found for this basename: LIPASE, AMYLASE,  in the last 168 hours No results found for this basename: AMMONIA,  in the last 168 hours CBC:  Recent Labs Lab 10/01/13 1736 10/02/13 0312 10/03/13 0419 10/04/13 0347  WBC 15.8* 11.2* 8.5 8.4  HGB 15.5* 13.4 14.1 14.0  HCT 44.0 38.6 41.1 40.8  MCV 93.6 93.5 94.3 93.6  PLT 427* 350 360 321   Cardiac Enzymes:  Recent Labs Lab 10/01/13 2314 10/02/13 0312 10/02/13 1009  TROPONINI <0.30 <0.30 <0.30   BNP (last 3 results)  Recent Labs  04/21/13 1515 04/22/13 0514 10/01/13 1737  PROBNP 375.9* 361.4* 206.1*   CBG: No results found for this basename: GLUCAP,  in the last 168 hours  Recent Results (from the past 240 hour(s))  MRSA PCR SCREENING     Status: None   Collection Time    10/01/13 10:10 PM      Result Value Ref Range Status   MRSA by PCR NEGATIVE  NEGATIVE Final   Comment:            The GeneXpert MRSA Assay (FDA     approved for NASAL specimens  only), is one component of a     comprehensive MRSA colonization     surveillance program. It is not     intended to diagnose MRSA     infection nor to guide or     monitor treatment for     MRSA infections.     Studies: No results found.  Scheduled Meds: . apixaban  5 mg Oral BID  . azithromycin  500 mg Intravenous Q24H  . budesonide-formoterol  2 puff Inhalation BID  . cefTRIAXone (ROCEPHIN)  IV  1 g Intravenous Q24H  . diltiazem  240 mg Oral Daily  . folic acid  1 mg Oral Daily  . LORazepam  0-4 mg Oral Q12H  . metoprolol tartrate  25 mg Oral BID  . multivitamin with minerals  1 tablet Oral Daily  . thiamine  100 mg Oral Daily   Continuous Infusions:    Principal Problem:   Atrial fibrillation with RVR Active Problems:   COPD GOLD II   Community acquired  pneumonia    Time spent: >35 minutes     Esperanza SheetsBURIEV, Trevia Nop N  [facility] Pager [phone]. If 7PM-7AM, please contact night-coverage at [email or web address], password [email or web address] 10/04/2013, 11:25 AM  LOS: 3 days

## 2013-10-04 NOTE — Progress Notes (Signed)
Patient Name: Kathy RousselKathy L Frost Date of Encounter: 10/04/2013   Principal Problem:   Atrial fibrillation with RVR Active Problems:   COPD GOLD II   Community acquired pneumonia   SUBJECTIVE  No chest pain, sob overnight.  She did have a brief run of palpitations yesterday afternoon coinciding with a 15 beat run of NSVT on tele.  She denies any presyncope or c/p assoc with that episode.  CURRENT MEDS . apixaban  5 mg Oral BID  . azithromycin  500 mg Intravenous Q24H  . budesonide-formoterol  2 puff Inhalation BID  . cefTRIAXone (ROCEPHIN)  IV  1 g Intravenous Q24H  . diltiazem  240 mg Oral Daily  . folic acid  1 mg Oral Daily  . LORazepam  0-4 mg Oral Q12H  . metoprolol tartrate  25 mg Oral BID  . multivitamin with minerals  1 tablet Oral Daily  . thiamine  100 mg Oral Daily    OBJECTIVE  Filed Vitals:   10/03/13 2117 10/04/13 0006 10/04/13 0441 10/04/13 0730  BP: 102/55 116/71 106/64 101/42  Pulse: 81 69 69 63  Temp:  97.5 F (36.4 C) 97.5 F (36.4 C) 97.5 F (36.4 C)  TempSrc:  Oral Oral Oral  Resp:  17 17 18   Height:      Weight:  155 lb 6.8 oz (70.5 kg)    SpO2:  95% 95% 94%    Intake/Output Summary (Last 24 hours) at 10/04/13 0903 Last data filed at 10/03/13 2221  Gross per 24 hour  Intake    420 ml  Output      0 ml  Net    420 ml   Filed Weights   10/02/13 0510 10/03/13 0014 10/04/13 0006  Weight: 153 lb 10.6 oz (69.7 kg) 154 lb 5.2 oz (70 kg) 155 lb 6.8 oz (70.5 kg)    PHYSICAL EXAM  General: Pleasant, NAD. Neuro: Alert and oriented X 3. Moves all extremities spontaneously. Psych: Normal affect. HEENT:  Normal  Neck: Supple without bruits or JVD. Lungs:  Resp regular and unlabored, CTA. Heart: RRR no s3, s4, or murmurs. Abdomen: Soft, non-tender, non-distended, BS + x 4.  Extremities: No clubbing, cyanosis or edema. DP/PT/Radials 2+ and equal bilaterally.  Accessory Clinical Findings  CBC  Recent Labs  10/03/13 0419 10/04/13 0347  WBC  8.5 8.4  HGB 14.1 14.0  HCT 41.1 40.8  MCV 94.3 93.6  PLT 360 321   Basic Metabolic Panel  Recent Labs  10/01/13 1736  NA 136*  K 4.2  CL 96  CO2 20  GLUCOSE 147*  BUN 18  CREATININE 0.81  CALCIUM 9.3   Cardiac Enzymes  Recent Labs  10/01/13 2314 10/02/13 0312 10/02/13 1009  TROPONINI <0.30 <0.30 <0.30   D-Dimer  Recent Labs  10/01/13 2314  DDIMER <0.27   Thyroid Function Tests  Recent Labs  10/01/13 2314  TSH 1.400  T3FREE 2.9    TELE  Rsr.  15 beat run of nsvt yesterday @ 16:28.  Radiology/Studies  Dg Chest Port 1 View  10/01/2013   CLINICAL DATA:  Chest pain.  Atrial fibrillation.  History of COPD.  EXAM: PORTABLE CHEST - 1 VIEW  COMPARISON:  04/21/2013  FINDINGS: The cardiac silhouette is within normal limits for size. The lungs are hyperinflated. There is hazy opacity in the right middle lobe with silhouetting of the right heart border. Small round opacities in both lung bases likely represent nipple shadows. No pleural effusion or pneumothorax is  identified. No acute osseous abnormality is seen.  IMPRESSION: New right middle lobe infiltrate, concerning for pneumonia.   Electronically Signed   By: Sebastian Ache   On: 10/01/2013 17:52    ASSESSMENT AND PLAN  1. Afib w/ RVR:  S/p successful dccv yesterday.  CHA2DS2VASc = 1, Cont eliquis x at least 4 wks.  Cont bb/dilt.  Will arrange office f/u.  2.  PNA:  abx per IM.  3.  NSVT:  15 beats.  She felt palpitations but was otw asymptomatic.  EF 55-60% by echo this admission.  Will f/u bmet/Mg this AM.  Cont BB.  Signed, Nicolasa Ducking NP  Patient seen, examined. Available data reviewed. Agree with findings, assessment, and plan as outlined by Ward Givens, NP. Exam reveals alert, oriented woman in NAD. Lungs CTA, heart RRR without murmur or gallop, extremities without edema. Tele shows sinus rhythm with one nonsustained run of WCT. I think stable for d/c on Eliquis, cardizem, and metoprolol. F/U with  Dr Antoine Poche will be arranged.  Tonny Bollman, M.D. 10/04/2013 9:58 AM

## 2013-10-22 ENCOUNTER — Ambulatory Visit (INDEPENDENT_AMBULATORY_CARE_PROVIDER_SITE_OTHER): Payer: BC Managed Care – PPO | Admitting: Cardiology

## 2013-10-22 ENCOUNTER — Encounter: Payer: Self-pay | Admitting: Cardiology

## 2013-10-22 VITALS — BP 110/60 | HR 72 | Ht 68.0 in | Wt 147.0 lb

## 2013-10-22 DIAGNOSIS — I4891 Unspecified atrial fibrillation: Secondary | ICD-10-CM

## 2013-10-22 MED ORDER — DILTIAZEM HCL ER COATED BEADS 120 MG PO CP24: 120.0000 mg | ORAL_CAPSULE | Freq: Every day | ORAL | Status: DC

## 2013-10-22 NOTE — Patient Instructions (Addendum)
Please stop your Metoprolol. Decrease Cardizem to 120 mg daily. You may stop the Eliquis in 4 weeks. Continue all other medications as listed.  Follow up in 4 months with Dr Antoine PocheHochrein.  You will receive a letter in the mail 2 months before you are due.  Please call us when you receive this letter to schedule your follow up appointment.

## 2013-10-22 NOTE — Progress Notes (Signed)
HPI he patient has no prior cardiac history. She does have a history of chronic lung disease. She reports that today at about 3 pm she felt her heart racing. She had increased SOB with presyncope. She did not have chest pain, neck or arm pain. She did no have LOC. She had never had these symptoms before. She presented to the ED where she was found to have atrial fib with RVR rate in the 170s. She did slow down with IV cardizem and she feels relatively well now. She otherwise denies any recent cough, fevers or chills. She has ha no PND or orthopnea. She does her chores and walks stairs with some mild DOE that is chronic. On CXR in the ER she is found to have a new right middle lobe infiltrate concerning for pneumonia. She has had a "head cold" recently.   Echo demonstrated a normal EF and no significant valvular abnormalities.  She underwent cardioversion. Since I saw her she has had no further tachypalpitations. She has otherwise been doing okay except she is very fatigued particularly later in the day. She said this is very unusual for her. She has not had any presyncope or syncope. She denies any chest pressure, neck or arm discomfort. She has no weight gain or edema.   Allergies  Allergen Reactions  . Morphine And Related     Sick on stomach     Current Outpatient Prescriptions  Medication Sig Dispense Refill  . apixaban (ELIQUIS) 5 MG TABS tablet Take 1 tablet (5 mg total) by mouth 2 (two) times daily.  60 tablet  3  . budesonide-formoterol (SYMBICORT) 160-4.5 MCG/ACT inhaler Take 2 puffs first thing in am and then another 2 puffs about 12 hours later.  1 Inhaler  11  . diltiazem (CARDIZEM CD) 240 MG 24 hr capsule Take 1 capsule (240 mg total) by mouth daily.  30 capsule  6  . FOLIC ACID PO Take by mouth.      . metoprolol tartrate (LOPRESSOR) 25 MG tablet Take 1 tablet (25 mg total) by mouth 2 (two) times daily.  60 tablet  6  . Multiple Vitamins-Minerals (WOMENS ONE DAILY) TABS Take by  mouth.       No current facility-administered medications for this visit.    Past Medical History  Diagnosis Date  . Bronchitis   . COPD (chronic obstructive pulmonary disease)   . PAF (paroxysmal atrial fibrillation)     a. 09/2013 Echo: EF 55-60%, gr 1 DD, mild MR, mildly dil RA;  b. 09/2013 successful DCCV.    Past Surgical History  Procedure Laterality Date  . Cholecystectomy    . Abdominal hysterectomy    . Appendectomy    . Nasal sinus surgery    . Cardioversion N/A 10/03/2013    Procedure: CARDIOVERSION    (BEDSIDE) ;  Surgeon: Cassell Clementhomas Brackbill, MD;  [facility];  Service: Cardiovascular;  Laterality: N/A;    ROS:  As stated in the HPI and negative for all other systems.  PHYSICAL EXAM BP 110/60  Pulse 72  Ht 5\' 8"  (1.727 m)  Wt 147 lb (66.679 kg)  BMI 22.36 kg/m2 GENERAL:  Well appearing HEENT:  Pupils equal round and reactive, fundi not visualized, oral mucosa unremarkable NECK:  No jugular venous distention, waveform within normal limits, carotid upstroke brisk and symmetric, no bruits, no thyromegaly LYMPHATICS:  No cervical, inguinal adenopathy LUNGS:  Clear to auscultation bilaterally BACK:  No CVA tenderness CHEST:  Unremarkable HEART:  PMI not displaced or sustained,S1 and S2 within normal limits, no S3, no S4, no clicks, no rubs, no murmurs ABD:  Flat, positive bowel sounds normal in frequency in pitch, no bruits, no rebound, no guarding, no midline pulsatile mass, no hepatomegaly, no splenomegaly EXT:  2 plus pulses throughout, no edema, no cyanosis no clubbing SKIN:  No rashes no nodules NEURO:  Cranial nerves II through XII grossly intact, motor grossly intact throughout PSYCH:  Cognitively intact, oriented to person place and time  ASSESSMENT AND PLAN  ATRIAL FIB:  I think her fatigue is related to the beta blocker and calcium blocker. I will reduce the calcium channel blocker 120 mg daily and stop the beta blocker. She will finish a total of 4  weeks of the Eliquis and then she can stop this. Her thromboembolic risk is low. We talked about the association of alcohol and the need to reduce or eliminate this.  We did discuss the need for followup should she have any recurrent atrial fibrillation.  FATIGUE:  I will see her back in about 4 months. At that time I will consider whether she needs continued Cardizem which will be discontinued if she has continued fatigue.

## 2013-11-05 ENCOUNTER — Ambulatory Visit: Payer: BC Managed Care – PPO | Admitting: Internal Medicine

## 2013-11-12 ENCOUNTER — Encounter: Payer: Self-pay | Admitting: Internal Medicine

## 2013-11-12 ENCOUNTER — Ambulatory Visit (INDEPENDENT_AMBULATORY_CARE_PROVIDER_SITE_OTHER): Payer: BC Managed Care – PPO | Admitting: Internal Medicine

## 2013-11-12 VITALS — BP 106/68 | HR 94 | Temp 98.1°F | Ht 68.0 in | Wt 151.0 lb

## 2013-11-12 DIAGNOSIS — J449 Chronic obstructive pulmonary disease, unspecified: Secondary | ICD-10-CM

## 2013-11-12 NOTE — Progress Notes (Signed)
Subjective:    Patient ID: Kathy RousselKathy L Frost, female    DOB: 06-04-1951    MRN: [id number]   Brief patient profile:  62 yowf quit smoking 05/2013  with new onset sob x spring 2014 referred 05/08/2013 to pulmonary clinic by Dr Robb Matarrtiz p admit 04/21/13 with ? aecopd with hypoxemic resp failure with documented GOLD II 06/2013   History of Present Illness  05/08/2013 1st [facility] office visit/ Kathy Frost cc indolent onset progressive doe x 6 month x more than adls such as vaccuming, yard work,  much worse since onset of cough early October rx as bronchitis with zpak prior to admit  Admit date: 04/21/2013  Discharge date: 04/24/2013 Discharge Diagnoses:  Principal Problem:  Acute respiratory failure with hypoxia  Active Problems:  Acute bronchitis  Discharge Condition: stable  Diet recommendation: heart healthy  Filed Weights    04/22/13 0538  04/23/13 0615  04/24/13 0541   Weight:  65.2 kg (143 lb 11.8 oz)  65.5 kg (144 lb 6.4 oz)  64.5 kg (142 lb 3.2 oz)   History of present illness:  63 y.o. female with history of tobacco abuse presents with complaints of worsening shortness of breath over the last 2 weeks. Patient has been short of breath even at rest and is associated wheezing. In the ER patient had required 4 L of oxygen to maintain saturation. Patient denies any chest pain. Has been having subjective feeling of fever and chills with productive cough. Denies any nausea vomiting abdominal pain or diarrhea. Patient has been a minute for acute hypoxic respiratory failure probably secondary to acute bronchitis.  Hospital Course:  Acute respiratory failure with hypoxia/ Acute bronchitis:  - treated with ab'x, inhalers and steroids  -Continue antibiotics orally.  -continue Nebs and pulmicort.  -will need outpatient PFT's and pulmonology evaluation  -smoking cessation recommended  Tobacco abuse:  - Cessation counseling provided.  - Continue nicotine patch.  Alcohol abuse:  - Cessation  couseling provided.  - Continue thiamine, folic acid and CIWA protocol. No withdrawal symptoms currently.  -Will discontinue schedule Ativan.  Chronic skin rash:  - Unchanged.  - Will need dermatology evaluation in outpatient setting (might even need skin biopsy)  Procedures:  ECHO: EF WNL   On day of ov much better, finished prednisone 05/01/13 and on spiriva 2 puffs each am and pulmicort bid per neb cc dry mouth and no longer limited from desired activities but not back to nl level of activity yet rec A = automatic = symbicort 160 Take 2 puffs first thing in am and then another 2 puffs about 12 hours later.  Plan B = Backup = Only use your albuterol as a rescue medication    06/19/2013 f/u ov/Kathy Frost re: copd GOLD II  Chief Complaint  Patient presents with  . COPD    Breathing is unchanged. Had PFT today. Denies SOB, chest tightness, coughing or wheezing.   Doing great, no cough prefers spiriva, no saba at all , no limits to activity due to sob rec GOLD II copd = mild,  Unlikely progress significantly unless resume smoker  Plan A = automatic = spiriva one daily  - if mouth too dry first try off it to see what difference it makes and when you need more plan B and if so return here Plan B = Backup = Only use your albuterol (ventolin) as rescue    07/01/2013 acute  ov/Kathy Frost re: aecopd  Chief Complaint  Patient presents with  . Acute Visit  Pt c/o cough and congestion for the past 3 days- cough started out prod with green sputum, but is now non prod. She states that she did not sleep well last night due to SOB and had to use her inhaler x 4 last night.   Has neb and used x one and helped 7 h prior to OV and no saba hfa use on day of ov rec zpak mucinex dm 1200 mg every 12 h supplement with vicodin if needed  Prednisone 10 mg take  4 each am x 2 days,   2 each am x 2 days,  1 each am x 2 days and stop   New plan A = automatic symbicort 160 Take 2 puffs first thing in am and then  another 2 puffs about 12 hours later                         spiriva one capsule daily until you use it up then try off to see if you do just as well or better on just symbicort   Plan B = Backup =  Only use your albuterol as a rescue medication to be used if you can't catch your breath by resting or doing a relaxed purse lip breathing pattern.  - The less you use it, the better it will work when you need it. - Ok to use up to 2 puff every 4 hours if you must but call for immediate appointment if use goes up over your usual need - Don't leave home without it !!  (think of it like your spare tire for your car)   Plan C = backup to plan B, use albuterol neb up to every 4 hours if needed   11/12/2013 final f/u ov / Kathy Frost / COPD GOLD II off spiriva x months, off symbicort x 24h No need for rescue at all  Gets some some winded with jogging but ok walking including up hills and steps   No obvious day to day or daytime variabilty or assoc cough  cp or chest tightness, subjective wheeze overt sinus or hb symptoms. No unusual exp hx or h/o childhood pna/ asthma or knowledge of premature birth.  Sleeping ok without nocturnal  or early am exacerbation  of respiratory  c/o's or need for noct saba. Also denies any obvious fluctuation of symptoms with weather or environmental changes or other aggravating or alleviating factors except as outlined above   Current Medications, Allergies, Complete Past Medical History, Past Surgical History, Family History, and Social History were reviewed in [facility] electronic medical record.  ROS  The following are not active complaints unless bolded sore throat, dysphagia, dental problems, itching, sneezing,  nasal congestion or excess/ purulent secretions, ear ache,   fever, chills, sweats, unintended wt loss, pleuritic or exertional cp, hemoptysis,  orthopnea pnd or leg swelling, presyncope, palpitations, heartburn, abdominal pain, anorexia, nausea, vomiting, diarrhea   or change in bowel or urinary habits, change in stools or urine, dysuria,hematuria,  rash, arthralgias, visual complaints, headache, numbness weakness or ataxia or problems with walking or coordination,  change in mood/affect or memory.              Objective:   Physical Exam  amb thin wf nad  06/19/2013      146  > 07/01/2013  145 > 11/12/13  151  Wt Readings from Last 3 Encounters:  05/08/13 144 lb (65.318 kg)  04/24/13 142 lb 3.2 oz (  64.5 kg)  12/03/12 148 lb (67.132 kg)      HEENT mild turbinate edema.  Oropharynx no thrush or excess pnd or cobblestoning.  No JVD or cervical adenopathy. Mild accessory muscle hypertrophy. Trachea midline, nl thryroid. Chest was mildly hyperinflated by percussion with diminished breath sounds and mild increased exp time without wheeze. Hoover sign positive at late inspiration. Regular rate and rhythm without murmur gallop or rub or increase P2 or edema.  Abd: no hsm, nl excursion. Ext warm without cyanosis or clubbing.    cxr 04/21/13 No acute cardiopulmonary disease.  Mild lung base atelectasis.       Assessment & Plan:

## 2013-11-12 NOTE — Patient Instructions (Signed)
Ok to leave off symbicort as long as not loosing ground with activity tolerance   If you are satisfied with your treatment plan let your doctor know and he/she can either refill your medications or you can return here when your prescription runs out.     If in any way you are not 100% satisfied,  please tell us.  If 100% better, tell your friends!

## 2013-11-14 NOTE — Assessment & Plan Note (Addendum)
-   requested spiriva over symbicort as maint 05/31/2013  - PFT's  06/19/2013  FEV1  1.98 (71%) ratio 65 and no better B2,  DLCO 62 corrects to 86% -  06/19/2013 p extensive coaching HFA effectiveness =    90%  - stopped spiriva around 08/2013 s clinical change - changed symbicort 160 to 2 bid prn 11/12/13   Since symbicort's immediate benefit mostly occurs in 5 m and pt reluctant to stay on a maint rx it makes sense to use symbicort, not spiriva, prn     Each maintenance medication was reviewed in detail including most importantly the difference between maintenance and as needed and under what circumstances the prns are to be used.  Please see instructions for details which were reviewed in writing and the patient given a copy.

## 2014-03-12 ENCOUNTER — Telehealth: Payer: Self-pay | Admitting: Internal Medicine

## 2014-03-12 NOTE — Telephone Encounter (Signed)
MW please advise if you want the pt to have the prevnar 13 shot or just the flu vaccine.  thanks

## 2014-03-12 NOTE — Telephone Encounter (Signed)
Both but I meant she would come in for ov, not just come in for the shot - if she just wants the shot she should get it from her primary

## 2014-03-12 NOTE — Telephone Encounter (Signed)
Ok to make appt for this

## 2014-03-12 NOTE — Telephone Encounter (Signed)
"  CONTACT us" message copied below:    Would like to make an appointment for my annual flu and pneumonia shot.  I had received them when I was in the hospital last October for COPD.   I was hoping to be able to get them thru your office.  I am a patient of Dr. Sherene Sires.  Thank You  Kathy Frost

## 2014-03-12 NOTE — Telephone Encounter (Signed)
Flu and PNA vaccine 23 was given to pt 04/22/13 Pt last seen 11/22/13 No pending appt. Please advise MW thanks

## 2014-03-13 NOTE — Telephone Encounter (Signed)
Spoke with pt and given appt with MW on 03/19/14 at 2:30.

## 2014-03-19 ENCOUNTER — Ambulatory Visit (INDEPENDENT_AMBULATORY_CARE_PROVIDER_SITE_OTHER): Payer: BC Managed Care – PPO | Admitting: Internal Medicine

## 2014-03-19 ENCOUNTER — Encounter: Payer: Self-pay | Admitting: Internal Medicine

## 2014-03-19 ENCOUNTER — Ambulatory Visit (INDEPENDENT_AMBULATORY_CARE_PROVIDER_SITE_OTHER)
Admission: RE | Admit: 2014-03-19 | Discharge: 2014-03-19 | Disposition: A | Discharge status: Home / Self Care | Payer: BC Managed Care – PPO | Source: Ambulatory Visit | Attending: Internal Medicine | Admitting: Internal Medicine

## 2014-03-19 VITALS — BP 90/60 | HR 79 | Temp 97.7°F | Ht 68.0 in | Wt 156.0 lb

## 2014-03-19 DIAGNOSIS — Z23 Encounter for immunization: Secondary | ICD-10-CM

## 2014-03-19 DIAGNOSIS — J449 Chronic obstructive pulmonary disease, unspecified: Secondary | ICD-10-CM

## 2014-03-19 NOTE — Patient Instructions (Addendum)
Please remember to go to the x-ray department downstairs for your tests - we will call you with the results when they are available.  No change on medications  Flu shot today,  Prevnar reasonable at age 63  since you received pneumovax in 2014    If you are satisfied with your treatment plan,  let your doctor know and he/she can either refill your medications or you can return here when your prescription runs out.     If in any way you are not 100% satisfied,  please tell us.  If 100% better, tell your friends!  Pulmonary follow up is as needed

## 2014-03-19 NOTE — Progress Notes (Signed)
Subjective:    Patient ID: Kathy Frost, female    DOB: [birth year]    MRN: [id number]   Brief patient profile:  62 yowf quit smoking 05/2013  with new onset sob x spring 2014 referred 05/08/2013 to pulmonary clinic by Dr Robb Matar p admit 04/21/13 with ? aecopd with hypoxemic resp failure with documented GOLD II 06/2013   History of Present Illness  05/08/2013 1st [facility] office visit/ Kathy Frost cc indolent onset progressive doe x 6 month x more than adls such as vaccuming, yard work,  much worse since onset of cough early October rx as bronchitis with zpak prior to admit  Admit date: 04/21/2013  Discharge date: 04/24/2013 Discharge Diagnoses:  Principal Problem:  Acute respiratory failure with hypoxia  Active Problems:  Acute bronchitis  Discharge Condition: stable  Diet recommendation: heart healthy  Filed Weights    04/22/13 0538  04/23/13 0615  04/24/13 0541   Weight:  65.2 kg (143 lb 11.8 oz)  65.5 kg (144 lb 6.4 oz)  64.5 kg (142 lb 3.2 oz)   History of present illness:  63 y.o. female with history of tobacco abuse presents with complaints of worsening shortness of breath over the last 2 weeks. Patient has been short of breath even at rest and is associated wheezing. In the ER patient had required 4 L of oxygen to maintain saturation. Patient denies any chest pain. Has been having subjective feeling of fever and chills with productive cough. Denies any nausea vomiting abdominal pain or diarrhea. Patient has been a minute for acute hypoxic respiratory failure probably secondary to acute bronchitis.  Hospital Course:  Acute respiratory failure with hypoxia/ Acute bronchitis:  - treated with ab'x, inhalers and steroids  -Continue antibiotics orally.  -continue Nebs and pulmicort.  -will need outpatient PFT's and pulmonology evaluation  -smoking cessation recommended  Tobacco abuse:  - Cessation counseling provided.  - Continue nicotine patch.  Alcohol abuse:  - Cessation  couseling provided.  - Continue thiamine, folic acid and CIWA protocol. No withdrawal symptoms currently.  -Will discontinue schedule Ativan.  Chronic skin rash:  - Unchanged.  - Will need dermatology evaluation in outpatient setting (might even need skin biopsy)  Procedures:  ECHO: EF WNL   On day of ov much better, finished prednisone 05/01/13 and on spiriva 2 puffs each am and pulmicort bid per neb cc dry mouth and no longer limited from desired activities but not back to nl level of activity yet rec A = automatic = symbicort 160 Take 2 puffs first thing in am and then another 2 puffs about 12 hours later.  Plan B = Backup = Only use your albuterol as a rescue medication    06/19/2013 f/u ov/Kathy Frost re: copd GOLD II  Chief Complaint  Patient presents with  . COPD    Breathing is unchanged. Had PFT today. Denies SOB, chest tightness, coughing or wheezing.   Doing great, no cough prefers spiriva, no saba at all , no limits to activity due to sob rec GOLD II copd = mild,  Unlikely progress significantly unless resume smoker  Plan A = automatic = spiriva one daily  - if mouth too dry first try off it to see what difference it makes and when you need more plan B and if so return here Plan B = Backup = Only use your albuterol (ventolin) as rescue      03/19/2014 f/u ov/Kathy Frost re: GOLD II on prn symbicort / no longer on spiriva Chief  Complaint  Patient presents with  . Follow-up    Pt here for flu vaccine. She states that her breathing is unchanged since her last visit. She uses rescue inhaler on average 3 x per month.   [place] problem and Not limited by breathing from desired activities  On Symbicort 160 2 each am just with onset of hot weather   No obvious day to day or daytime variabilty or assoc cough  cp or chest tightness, subjective wheeze overt sinus or hb symptoms. No unusual exp hx or h/o childhood pna/ asthma or knowledge of premature birth.  Sleeping ok  without nocturnal  or early am exacerbation  of respiratory  c/o's or need for noct saba. Also denies any obvious fluctuation of symptoms with weather or environmental changes or other aggravating or alleviating factors except as outlined above   Current Medications, Allergies, Complete Past Medical History, Past Surgical History, Family History, and Social History were reviewed in [employer] record.  ROS  The following are not active complaints unless bolded sore throat, dysphagia, dental problems, itching, sneezing,  nasal congestion or excess/ purulent secretions, ear ache,   fever, chills, sweats, unintended wt loss, pleuritic or exertional cp, hemoptysis,  orthopnea pnd or leg swelling, presyncope, palpitations, heartburn, abdominal pain, anorexia, nausea, vomiting, diarrhea  or change in bowel or urinary habits, change in stools or urine, dysuria,hematuria,  rash, arthralgias, visual complaints, headache, numbness weakness or ataxia or problems with walking or coordination,  change in mood/affect or memory.              Objective:   Physical Exam  amb thin wf nad  06/19/2013      146  > 07/01/2013  145 > 11/12/13  151 > 03/19/2014  156  Wt Readings from Last 3 Encounters:  05/08/13 144 lb (65.318 kg)  04/24/13 142 lb 3.2 oz (64.5 kg)  12/03/12 148 lb (67.132 kg)      HEENT mild turbinate edema.  Oropharynx no thrush or excess pnd or cobblestoning.  No JVD or cervical adenopathy. Mild accessory muscle hypertrophy. Trachea midline, nl thryroid. Chest was mildly hyperinflated by percussion with diminished breath sounds and mild increased exp time without wheeze. Hoover sign positive at late inspiration. Regular rate and rhythm without murmur gallop or rub or increase P2 or edema.  Abd: no hsm, nl excursion. Ext warm without cyanosis or clubbing.     CXR  03/19/2014 : No acute disease. Pulmonary hyperexpansion compatible with emphysema        Assessment & Plan:

## 2014-03-20 NOTE — Progress Notes (Signed)
Quick Note:  Spoke with pt and notified of results per Dr. Wert. Pt verbalized understanding and denied any questions.  ______ 

## 2014-03-21 NOTE — Assessment & Plan Note (Addendum)
-   quit smoking 05/2013  - PFT's  06/19/2013  FEV1  1.98 (71%) ratio 65 and no better B2,  DLCO 62 corrects to 86% -  06/19/2013 p extensive coaching HFA effectiveness =    90%  - stopped spiriva around 08/2013 s clinical change  - changed symbicort 160 to 2 bid prn 11/12/13 >  She's really doing well as of ov 03/19/14 probably mostly due to maintaining off cigs  with very little need for symbicort or saba and no tendency to aecopd   I reviewed the Flethcher curve with patient that basically indicates  if you quit smoking when your best day FEV1 is still well preserved (as is clearly  the case here)  it is highly unlikely you will progress to severe disease and informed the patient there was no medication on the market that has proven to change the curve or the likelihood of progression.  Therefore stopping smoking and maintaining abstinence is the most important aspect of care, not choice of inhalers or for that matter, doctors.     Each maintenance medication was reviewed in detail including most importantly the difference between maintenance and as needed and under what circumstances the prns are to be used.  Please see instructions for details which were reviewed in writing and the patient given a copy  Pulmonary f/u is therefore prn

## 2014-09-08 ENCOUNTER — Other Ambulatory Visit: Payer: Self-pay | Admitting: Internal Medicine

## 2014-10-11 ENCOUNTER — Emergency Department (HOSPITAL_COMMUNITY)
Admission: EM | Admit: 2014-10-11 | Discharge: 2014-10-11 | Disposition: A | Discharge status: Home / Self Care | Payer: BLUE CROSS/BLUE SHIELD | Attending: Emergency Medicine | Admitting: Emergency Medicine

## 2014-10-11 ENCOUNTER — Encounter (HOSPITAL_COMMUNITY): Payer: Self-pay | Admitting: *Deleted

## 2014-10-11 DIAGNOSIS — Z79899 Other long term (current) drug therapy: Secondary | ICD-10-CM | POA: Insufficient documentation

## 2014-10-11 DIAGNOSIS — Z87891 Personal history of nicotine dependence: Secondary | ICD-10-CM | POA: Insufficient documentation

## 2014-10-11 DIAGNOSIS — J449 Chronic obstructive pulmonary disease, unspecified: Secondary | ICD-10-CM | POA: Insufficient documentation

## 2014-10-11 DIAGNOSIS — R51 Headache: Secondary | ICD-10-CM | POA: Diagnosis present

## 2014-10-11 DIAGNOSIS — J0111 Acute recurrent frontal sinusitis: Secondary | ICD-10-CM | POA: Diagnosis not present

## 2014-10-11 DIAGNOSIS — Z8679 Personal history of other diseases of the circulatory system: Secondary | ICD-10-CM | POA: Insufficient documentation

## 2014-10-11 MED ORDER — IBUPROFEN 800 MG PO TABS: 800.0000 mg | ORAL_TABLET | Freq: Three times a day (TID) | ORAL | Status: AC | PRN

## 2014-10-11 MED ORDER — AMOXICILLIN-POT CLAVULANATE 875-125 MG PO TABS: 1.0000 | ORAL_TABLET | Freq: Two times a day (BID) | ORAL | Status: DC

## 2014-10-11 MED ORDER — HYDROCODONE-ACETAMINOPHEN 5-325 MG PO TABS: 1.0000 | ORAL_TABLET | ORAL | Status: DC | PRN

## 2014-10-11 NOTE — ED Notes (Signed)
Declined W/C at D/C and was escorted to lobby by RN. 

## 2014-10-11 NOTE — Discharge Instructions (Signed)
Read the information below.  Use the prescribed medication as directed.  Please discuss all new medications with your pharmacist.  Do not take additional tylenol while taking the prescribed pain medication to avoid overdose.  You may return to the Emergency Department at any time for worsening condition or any new symptoms that concern you.  If you develop high fevers that do not resolve with medication, you have difficulty swallowing or breathing, or you are unable to tolerate fluids by mouth, return to the ER for a recheck.      Sinusitis Sinusitis is redness, soreness, and inflammation of the paranasal sinuses. Paranasal sinuses are air pockets within the bones of your face (beneath the eyes, the middle of the forehead, or above the eyes). In healthy paranasal sinuses, mucus is able to drain out, and air is able to circulate through them by way of your nose. However, when your paranasal sinuses are inflamed, mucus and air can become trapped. This can allow bacteria and other germs to grow and cause infection. Sinusitis can develop quickly and last only a short time (acute) or continue over a long period (chronic). Sinusitis that lasts for more than 12 weeks is considered chronic.  CAUSES  Causes of sinusitis include:  Allergies.  Structural abnormalities, such as displacement of the cartilage that separates your nostrils (deviated septum), which can decrease the air flow through your nose and sinuses and affect sinus drainage.  Functional abnormalities, such as when the small hairs (cilia) that line your sinuses and help remove mucus do not work properly or are not present. SIGNS AND SYMPTOMS  Symptoms of acute and chronic sinusitis are the same. The primary symptoms are pain and pressure around the affected sinuses. Other symptoms include:  Upper toothache.  Earache.  Headache.  Bad breath.  Decreased sense of smell and taste.  A cough, which worsens when you are lying  flat.  Fatigue.  Fever.  Thick drainage from your nose, which often is green and may contain pus (purulent).  Swelling and warmth over the affected sinuses. DIAGNOSIS  Your health care provider will perform a physical exam. During the exam, your health care provider may:  Look in your nose for signs of abnormal growths in your nostrils (nasal polyps).  Tap over the affected sinus to check for signs of infection.  View the inside of your sinuses (endoscopy) using an imaging device that has a light attached (endoscope). If your health care provider suspects that you have chronic sinusitis, one or more of the following tests may be recommended:  Allergy tests.  Nasal culture. A sample of mucus is taken from your nose, sent to a lab, and screened for bacteria.  Nasal cytology. A sample of mucus is taken from your nose and examined by your health care provider to determine if your sinusitis is related to an allergy. TREATMENT  Most cases of acute sinusitis are related to a viral infection and will resolve on their own within 10 days. Sometimes medicines are prescribed to help relieve symptoms (pain medicine, decongestants, nasal steroid sprays, or saline sprays).  However, for sinusitis related to a bacterial infection, your health care provider will prescribe antibiotic medicines. These are medicines that will help kill the bacteria causing the infection.  Rarely, sinusitis is caused by a fungal infection. In theses cases, your health care provider will prescribe antifungal medicine. For some cases of chronic sinusitis, surgery is needed. Generally, these are cases in which sinusitis recurs more than 3 times per  year, despite other treatments. HOME CARE INSTRUCTIONS   Drink plenty of water. Water helps thin the mucus so your sinuses can drain more easily.  Use a humidifier.  Inhale steam 3 to 4 times a day (for example, sit in the bathroom with the shower running).  Apply a warm,  moist washcloth to your face 3 to 4 times a day, or as directed by your health care provider.  Use saline nasal sprays to help moisten and clean your sinuses.  Take medicines only as directed by your health care provider.  If you were prescribed either an antibiotic or antifungal medicine, finish it all even if you start to feel better. SEEK IMMEDIATE MEDICAL CARE IF:  You have increasing pain or severe headaches.  You have nausea, vomiting, or drowsiness.  You have swelling around your face.  You have vision problems.  You have a stiff neck.  You have difficulty breathing. MAKE SURE YOU:   Understand these instructions.  Will watch your condition.  Will get help right away if you are not doing well or get worse. Document Released: 06/27/2005 Document Revised: 11/11/2013 Document Reviewed: 07/12/2011 [facility] Patient Information 2015 [place]. This information is not intended to replace advice given to you by your health care provider. Make sure you discuss any questions you have with your health care provider.

## 2014-10-11 NOTE — ED Provider Notes (Signed)
CSN: [id number]     Arrival date & time 10/11/14  1011 History  This chart is scribed for non-physician practitioner, Trixie DredgeEmily Shandrea Lusk, PA-C, working with Mirian MoMatthew Giovanelli, MD by Abel PrestoKara Demonbreun, ED Scribe.  This patient was seen in room TR05C/TR05C and the patient's care was started 10:43 AM.     Chief Complaint  Patient presents with  . Facial Pain   The history is provided by the patient. No language interpreter was used.   HPI Comments: Kathy Frost is a 64 y.o. female who presents to the Emergency Department complaining of recurrent left sided sinus congestion and pain with onset several weeks ago. Pt states pain radiates to teeth and ear. Pt has taken hydrocodone and oxycodone for with brief relief.  Pt with h/o of 2 nasal sinus surgeries. Pt was seen by PCP recently, diagnosed with a sinus infection and given a Z-pack and Flonase for relief. Pt has finished her course of the Z-pack. She reports the Flonase gives mild relief. Pt has an appointment with ENT on 10/17/14. Pt reports Pt denies fever, sore throat and cough.   Past Medical History  Diagnosis Date  . Bronchitis   . COPD (chronic obstructive pulmonary disease)   . PAF (paroxysmal atrial fibrillation)     a. 09/2013 Echo: EF 55-60%, gr 1 DD, mild MR, mildly dil RA;  b. 09/2013 successful DCCV.   Past Surgical History  Procedure Laterality Date  . Cholecystectomy    . Abdominal hysterectomy    . Appendectomy    . Nasal sinus surgery    . Cardioversion N/A 10/03/2013    Procedure: CARDIOVERSION    (BEDSIDE) ;  Surgeon: Cassell Clementhomas Brackbill, MD;  [facility];  Service: Cardiovascular;  Laterality: N/A;   Family History  Problem Relation Age of Onset  . Lung cancer Father     smoked  . Cirrhosis Mother   . Atrial fibrillation Brother    History  Substance Use Topics  . Smoking status: Former Smoker -- 1.00 packs/day for 45 years    Types: Cigarettes    Quit date: 04/18/2013  . Smokeless tobacco: Never Used  . Alcohol Use: 0.0  oz/week     Comment: vodka cocktail 4-5 times per wk   OB History    No data available     Review of Systems  Constitutional: Negative for fever and chills.  HENT: Positive for congestion. Negative for ear pain, facial swelling, sore throat and trouble swallowing.        Facial pain: left sinuses  Respiratory: Negative for cough and shortness of breath.   Cardiovascular: Negative for chest pain.  Musculoskeletal: Negative for neck pain and neck stiffness.  Skin: Negative for color change.  Allergic/Immunologic: Negative for immunocompromised state.  Hematological: Does not bruise/bleed easily.  Psychiatric/Behavioral: Negative for self-injury.      Allergies  Morphine and related  Home Medications   Prior to Admission medications   Medication Sig Start Date End Date Taking? Authorizing Provider  albuterol (VENTOLIN HFA) 108 (90 BASE) MCG/ACT inhaler Inhale 2 puffs into the lungs every 6 (six) hours as needed for wheezing or shortness of breath.    Historical Provider, MD  amoxicillin-clavulanate (AUGMENTIN) 875-125 MG per tablet Take 1 tablet by mouth 2 (two) times daily. One po bid x 7 days 10/11/14   Trixie DredgeEmily Tianni Escamilla, PA-C  diltiazem (CARDIZEM CD) 120 MG 24 hr capsule Take 1 capsule (120 mg total) by mouth daily. 10/22/13   Rollene RotundaJames Hochrein, MD  FOLIC  ACID PO Take 1 tablet by mouth daily.     Historical Provider, MD  HYDROcodone-acetaminophen (NORCO/VICODIN) 5-325 MG per tablet Take 1-2 tablets by mouth every 4 (four) hours as needed for moderate pain or severe pain. 10/11/14   Trixie Dredge, PA-C  ibuprofen (ADVIL,MOTRIN) 800 MG tablet Take 1 tablet (800 mg total) by mouth every 8 (eight) hours as needed for mild pain or moderate pain. 10/11/14   Trixie Dredge, PA-C  Multiple Vitamins-Minerals (WOMENS ONE DAILY) TABS Take 1 tablet by mouth daily.     Historical Provider, MD  SYMBICORT 160-4.5 MCG/ACT inhaler INHALE 2 PUFFS FIRST THING IN THE MORNING, INHALE ANOTHER 2 PUFFS ABOUT 12HOURS LATER  09/08/14   Nyoka Cowden, MD   BP 145/90 mmHg  Pulse 88  Temp(Src) 98.9 F (37.2 C) (Oral)  Resp 20  Wt 158 lb (71.668 kg)  SpO2 100% Physical Exam  Constitutional: She appears well-developed and well-nourished. No distress.  HENT:  Head: Normocephalic and atraumatic.  Nose: Rhinorrhea present. No mucosal edema. Right sinus exhibits no maxillary sinus tenderness and no frontal sinus tenderness. Left sinus exhibits frontal sinus tenderness. Left sinus exhibits no maxillary sinus tenderness.  Mouth/Throat: Oropharynx is clear and moist. No oropharyngeal exudate.  Left frontal sinuses tender; clear nasal discharge  Eyes: Conjunctivae are normal.  Neck: Normal range of motion. Neck supple.  Cardiovascular: Normal rate and regular rhythm.   Pulmonary/Chest: Effort normal and breath sounds normal. No stridor. No respiratory distress. She has no wheezes. She has no rales.  Lymphadenopathy:    She has no cervical adenopathy.  Neurological: She is alert.  Skin: She is not diaphoretic.  Nursing note and vitals reviewed.   ED Course  Procedures (including critical care time) DIAGNOSTIC STUDIES: Oxygen Saturation is 100% on room air, normal by my interpretation.    COORDINATION OF CARE: 10:46 AM Discussed treatment plan with patient at beside, the patient agrees with the plan and has no further questions at this time.   Labs Review Labs Reviewed - No data to display  Imaging Review No results found.   EKG Interpretation None      MDM   Final diagnoses:  Acute recurrent frontal sinusitis    Afebrile, nontoxic patient with left frontal sinus pain/pressure c/w sinusitis, hx sinus surgeries.  Z-pak given without complete improvement, there is increasing resistance to this in the community.   D/C home with augmentin, pain medication, ENT (Dr Jearld Fenton) follow up scheduled.  Discussed result, findings, treatment, and follow up  with patient.  Pt given return precautions.  Pt verbalizes  understanding and agrees with plan.       I personally performed the services described in this documentation, which was scribed in my presence. The recorded information has been reviewed and is accurate.    Trixie Dredge, PA-C 10/11/14 1053  Mirian Mo, MD 10/14/14 [phone]

## 2014-10-11 NOTE — ED Notes (Signed)
Pt in c/o sinus infection, states she was treated for this a few weeks ago with a z-pack and symptoms returned, history of this in the past

## 2014-11-02 IMAGING — CR DG CHEST 1V PORT
1 series · 1 of 1 positions shown · non-contrast
Comparison: 04/21/2013

CLINICAL DATA: Chest pain.  Atrial fibrillation.  History of COPD.

EXAM:
PORTABLE CHEST - 1 VIEW

[AP]
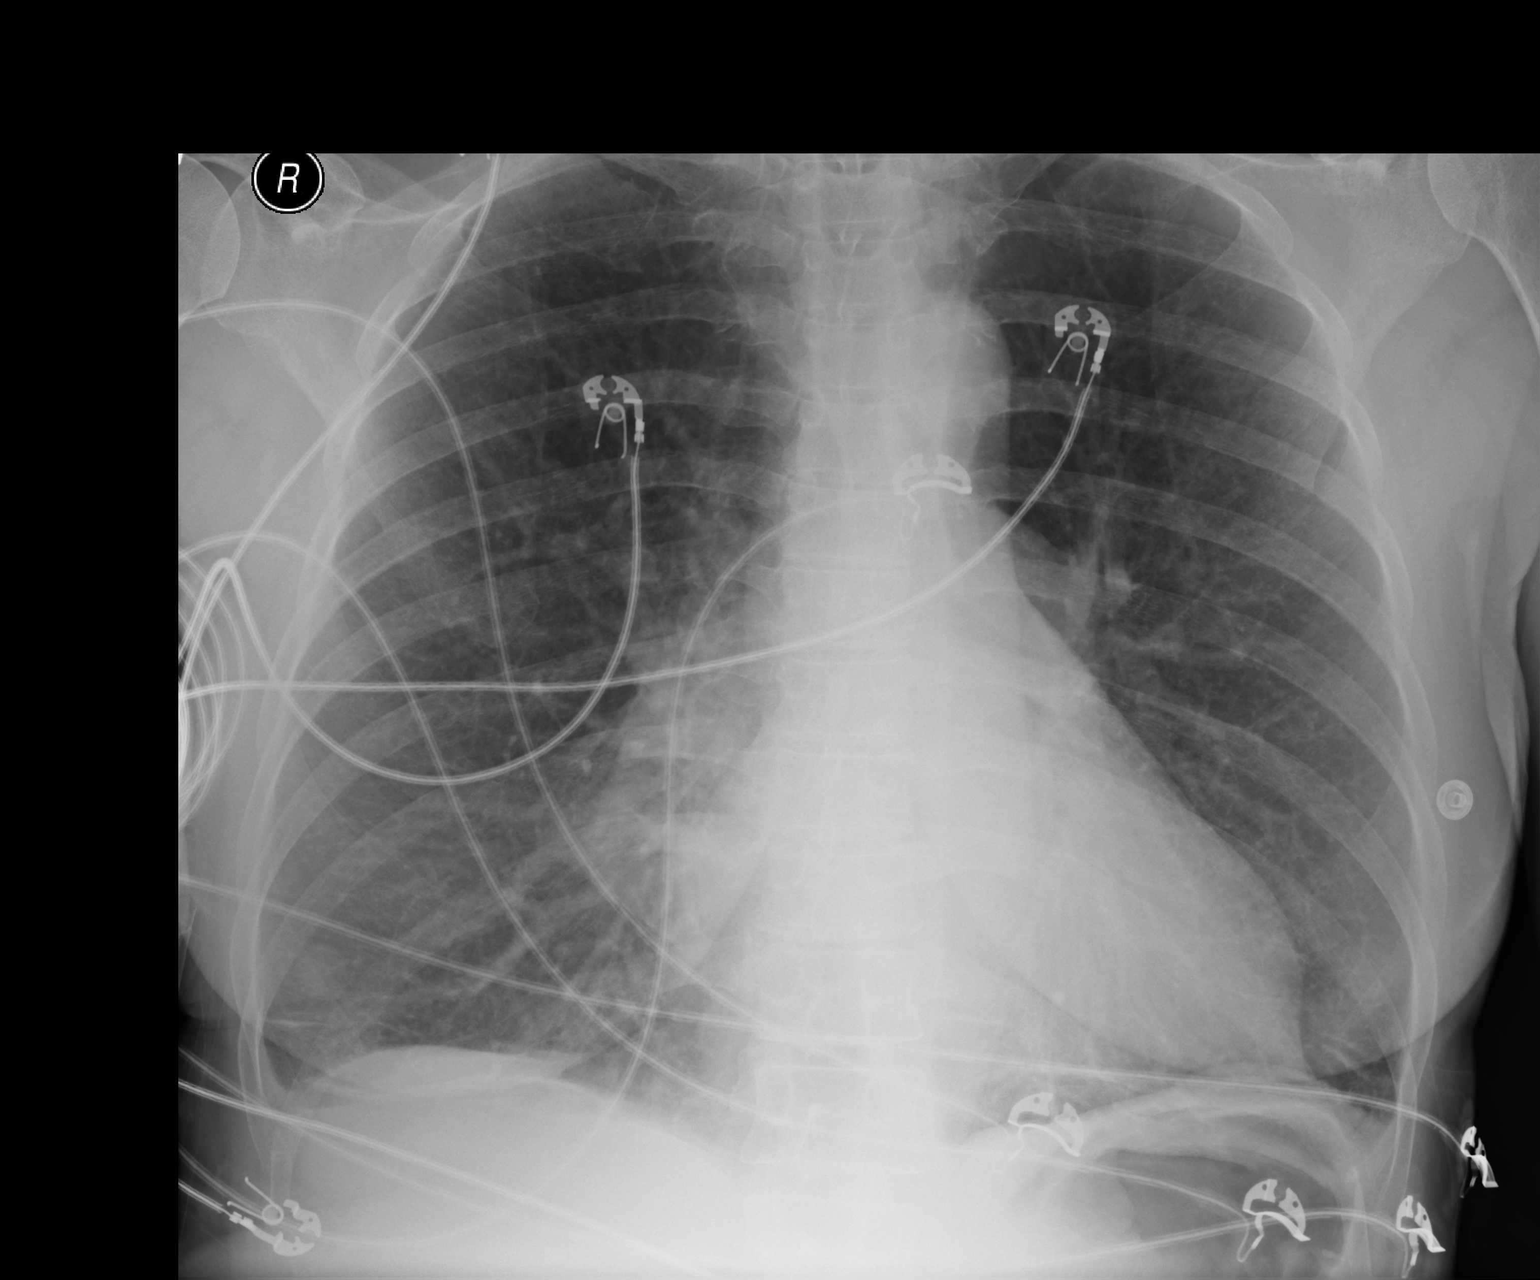

[1 of 1 positions shown; findings below may reference images not displayed]

FINDINGS: The cardiac silhouette is within normal limits for size. The lungs
are hyperinflated. There is hazy opacity in the right middle lobe
with silhouetting of the right heart border. Small round opacities
in both lung bases likely represent nipple shadows. No pleural
effusion or pneumothorax is identified. No acute osseous abnormality
is seen.
IMPRESSION: New right middle lobe infiltrate, concerning for pneumonia.

## 2015-03-11 ENCOUNTER — Other Ambulatory Visit: Payer: Self-pay | Admitting: Internal Medicine

## 2015-03-22 ENCOUNTER — Encounter (HOSPITAL_COMMUNITY): Payer: Self-pay | Admitting: *Deleted

## 2015-03-22 ENCOUNTER — Emergency Department (HOSPITAL_COMMUNITY)
Admission: EM | Admit: 2015-03-22 | Discharge: 2015-03-22 | Disposition: A | Discharge status: Home / Self Care | Payer: BLUE CROSS/BLUE SHIELD | Attending: Emergency Medicine | Admitting: Emergency Medicine

## 2015-03-22 DIAGNOSIS — Z87891 Personal history of nicotine dependence: Secondary | ICD-10-CM | POA: Insufficient documentation

## 2015-03-22 DIAGNOSIS — Z79899 Other long term (current) drug therapy: Secondary | ICD-10-CM | POA: Insufficient documentation

## 2015-03-22 DIAGNOSIS — J441 Chronic obstructive pulmonary disease with (acute) exacerbation: Secondary | ICD-10-CM | POA: Diagnosis not present

## 2015-03-22 DIAGNOSIS — J449 Chronic obstructive pulmonary disease, unspecified: Secondary | ICD-10-CM | POA: Diagnosis present

## 2015-03-22 DIAGNOSIS — I480 Paroxysmal atrial fibrillation: Secondary | ICD-10-CM | POA: Diagnosis present

## 2015-03-22 DIAGNOSIS — R0602 Shortness of breath: Secondary | ICD-10-CM | POA: Diagnosis present

## 2015-03-22 DIAGNOSIS — I4891 Unspecified atrial fibrillation: Secondary | ICD-10-CM | POA: Diagnosis not present

## 2015-03-22 DIAGNOSIS — J410 Simple chronic bronchitis: Secondary | ICD-10-CM

## 2015-03-22 DIAGNOSIS — F1020 Alcohol dependence, uncomplicated: Secondary | ICD-10-CM | POA: Diagnosis present

## 2015-03-22 HISTORY — DX: Alcohol dependence, uncomplicated: F10.20

## 2015-03-22 LAB — BASIC METABOLIC PANEL
Anion gap: 11 (ref 5–15)
BUN: 12 mg/dL (ref 6–20)
CALCIUM: 9.5 mg/dL (ref 8.9–10.3)
CO2: 24 mmol/L (ref 22–32)
CREATININE: 0.73 mg/dL (ref 0.44–1.00)
Chloride: 101 mmol/L (ref 101–111)
GFR calc Af Amer: >60 mL/min (ref >60)
GFR calc non Af Amer: >60 mL/min (ref >60)
GLUCOSE: 165 mg/dL — AB (ref 65–99)
Potassium: 4 mmol/L (ref 3.5–5.1)
SODIUM: 136 mmol/L (ref 135–145)

## 2015-03-22 LAB — CBC WITH DIFFERENTIAL/PLATELET
BASOS PCT: 0 % (ref 0–1)
Basophils Absolute: 0 10*3/uL (ref 0.0–0.1)
EOS ABS: 0.3 10*3/uL (ref 0.0–0.7)
EOS PCT: 3 % (ref 0–5)
HCT: 44.1 % (ref 36.0–46.0)
Hemoglobin: 14.7 g/dL (ref 12.0–15.0)
Lymphocytes Relative: 29 % (ref 12–46)
Lymphs Abs: 3.1 10*3/uL (ref 0.7–4.0)
MCH: 31.3 pg (ref 26.0–34.0)
MCHC: 33.3 g/dL (ref 30.0–36.0)
MCV: 93.8 fL (ref 78.0–100.0)
MONO ABS: 0.7 10*3/uL (ref 0.1–1.0)
Monocytes Relative: 7 % (ref 3–12)
Neutro Abs: 6.5 10*3/uL (ref 1.7–7.7)
Neutrophils Relative %: 61 % (ref 43–77)
PLATELETS: 426 10*3/uL — AB (ref 150–400)
RBC: 4.7 MIL/uL (ref 3.87–5.11)
RDW: 13.1 % (ref 11.5–15.5)
WBC: 10.6 10*3/uL — ABNORMAL HIGH (ref 4.0–10.5)

## 2015-03-22 LAB — I-STAT TROPONIN, ED: Troponin i, poc: 0 ng/mL (ref 0.00–0.08)

## 2015-03-22 MED ORDER — DILTIAZEM HCL 100 MG IV SOLR
5.0000 mg/h | INTRAVENOUS | Status: DC
  Administered 2015-03-22: 5 mg/h via INTRAVENOUS
  Filled 2015-03-22 (×2): qty 100

## 2015-03-22 MED ORDER — HEPARIN (PORCINE) IN NACL 100-0.45 UNIT/ML-% IJ SOLN
850.0000 [IU]/h | INTRAMUSCULAR | Status: DC
  Administered 2015-03-22: 850 [IU]/h via INTRAVENOUS
  Filled 2015-03-22: qty 250

## 2015-03-22 MED ORDER — ETOMIDATE 2 MG/ML IV SOLN
INTRAVENOUS | Status: AC
  Administered 2015-03-22: 15 mg
  Filled 2015-03-22: qty 10

## 2015-03-22 MED ORDER — ETOMIDATE 2 MG/ML IV SOLN
INTRAVENOUS | Status: AC | PRN
  Administered 2015-03-22: 20 mg via INTRAVENOUS

## 2015-03-22 MED ORDER — HEPARIN BOLUS VIA INFUSION
4000.0000 [IU] | Freq: Once | INTRAVENOUS | Status: AC
  Administered 2015-03-22: 4000 [IU] via INTRAVENOUS
  Filled 2015-03-22: qty 4000

## 2015-03-22 MED ORDER — MIDAZOLAM HCL 2 MG/2ML IJ SOLN
INTRAMUSCULAR | Status: AC
  Filled 2015-03-22: qty 2

## 2015-03-22 MED ORDER — APIXABAN 5 MG PO TABS: 5.0000 mg | ORAL_TABLET | Freq: Two times a day (BID) | ORAL | Status: DC

## 2015-03-22 MED ORDER — ETOMIDATE 2 MG/ML IV SOLN: 0.3000 mg/kg | Freq: Once | INTRAVENOUS | Status: DC

## 2015-03-22 MED ORDER — DILTIAZEM LOAD VIA INFUSION
20.0000 mg | Freq: Once | INTRAVENOUS | Status: AC
  Administered 2015-03-22: 20 mg via INTRAVENOUS
  Filled 2015-03-22: qty 20

## 2015-03-22 NOTE — CV Procedure (Signed)
EP procedure Note   Pre procedure Diagnosis:  Atrial fibrillation Post procedure Diagnosis:  Same  Procedures:  Electrical cardioversion  Description:  Informed, written consent was obtained for cardioversion.  Adequate IV acces and airway support were assured.  The patient was adequately sedated with intravenous etomidate as outlined in the ED physician report.  The patient presented today in atrial fibrillation.  She was successfully cardioverted to sinus rhythm with a single synchronized biphasic 200J shock delivered with cardioversion electrodes placed in the anterior/posterior configuration.  She remains in sinus rhythm thereafter. EBL 0ml. There were no early apparent complications.  Conclusions:  1.  Successful cardioversion of afib to sinus rhythm 2.  No early apparent complications.   Fayrene Fearing Othello Dickenson,MD 3:41 PM 03/22/2015

## 2015-03-22 NOTE — Progress Notes (Signed)
ANTICOAGULATION CONSULT NOTE - Initial Consult  Pharmacy Consult for Heparin Indication: atrial fibrillation  Allergies  Allergen Reactions  . Morphine And Related     Sick on stomach     Patient Measurements: TBW 72 kg as of April 2016  Vital Signs: BP: 132/89 mmHg (09/11 0919) Pulse Rate: 158 (09/11 0919)  Labs:  Recent Labs  03/22/15 0839  HGB 14.7  HCT 44.1  PLT 426*    CrCl cannot be calculated (Unknown ideal weight.).   Medical History: Past Medical History  Diagnosis Date  . Bronchitis   . COPD (chronic obstructive pulmonary disease)   . PAF (paroxysmal atrial fibrillation)     a. 09/2013 Echo: EF 55-60%, gr 1 DD, mild MR, mildly dil RA;  b. 09/2013 successful DCCV.    Assessment: 64 yo F presents on 9/11 with onset of palpitations last night. Patient does have hx of Afib but not on any anticoag at home. Pharmacy consulted to start heparin gtt. CBC stable. No s/s of bleed.  Goal of Therapy:  Heparin level 0.3-0.7 units/ml Monitor platelets by anticoagulation protocol: Yes   Plan:  Give heparin 4,000 units BOLUS Start heparin gtt at 850 units/hr Check 6 hr HL Monitor daily HL, CBC, s/s of bleed   Chidera Thivierge J 03/22/2015,9:53 AM

## 2015-03-22 NOTE — Discharge Instructions (Signed)
Atrial Fibrillation °Atrial fibrillation is a type of irregular heart rhythm (arrhythmia). During atrial fibrillation, the upper chambers of the heart (atria) quiver continuously in a chaotic pattern. This causes an irregular and often rapid heart rate.  °Atrial fibrillation is the result of the heart becoming overloaded with disorganized signals that tell it to beat. These signals are normally released one at a time by a part of the right atrium called the sinoatrial node. They then travel from the atria to the lower chambers of the heart (ventricles), causing the atria and ventricles to contract and pump blood as they pass. In atrial fibrillation, parts of the atria outside of the sinoatrial node also release these signals. This results in two problems. First, the atria receive so many signals that they do not have time to fully contract. Second, the ventricles, which can only receive one signal at a time, beat irregularly and out of rhythm with the atria.  °There are three types of atrial fibrillation:  °· Paroxysmal. Paroxysmal atrial fibrillation starts suddenly and stops on its own within a week. °· Persistent. Persistent atrial fibrillation lasts for more than a week. It may stop on its own or with treatment. °· Permanent. Permanent atrial fibrillation does not go away. Episodes of atrial fibrillation may lead to permanent atrial fibrillation. °Atrial fibrillation can prevent your heart from pumping blood normally. It increases your risk of stroke and can lead to heart failure.  °CAUSES  °· Heart conditions, including a heart attack, heart failure, coronary artery disease, and heart valve conditions.   °· Inflammation of the sac that surrounds the heart (pericarditis). °· Blockage of an artery in the lungs (pulmonary embolism). °· Pneumonia or other infections. °· Chronic lung disease. °· Thyroid problems, especially if the thyroid is overactive (hyperthyroidism). °· Caffeine, excessive alcohol use, and use  of some illegal drugs.   °· Use of some medicines, including certain decongestants and diet pills. °· Heart surgery.   °· Birth defects.   °Sometimes, no cause can be found. When this happens, the atrial fibrillation is called lone atrial fibrillation. The risk of complications from atrial fibrillation increases if you have lone atrial fibrillation and you are age 60 years or older. °RISK FACTORS °· Heart failure. °· Coronary artery disease. °· Diabetes mellitus.   °· High blood pressure (hypertension).   °· Obesity.   °· Other arrhythmias.   °· Increased age. °SIGNS AND SYMPTOMS  °· A feeling that your heart is beating rapidly or irregularly.   °· A feeling of discomfort or pain in your chest.   °· Shortness of breath.   °· Sudden light-headedness or weakness.   °· Getting tired easily when exercising.   °· Urinating more often than normal (mainly when atrial fibrillation first begins).   °In paroxysmal atrial fibrillation, symptoms may start and suddenly stop. °DIAGNOSIS  °Your health care provider may be able to detect atrial fibrillation when taking your pulse. Your health care provider may have you take a test called an ambulatory electrocardiogram (ECG). An ECG records your heartbeat patterns over a 24-hour period. You may also have other tests, such as: °· Transthoracic echocardiogram (TTE). During echocardiography, sound waves are used to evaluate how blood flows through your heart. °· Transesophageal echocardiogram (TEE). °· Stress test. There is more than one type of stress test. If a stress test is needed, ask your health care provider about which type is best for you. °· Chest X-ray exam. °· Blood tests. °· Computed tomography (CT). °TREATMENT  °Treatment may include: °· Treating any underlying conditions. For example, if you   have an overactive thyroid, treating the condition may correct atrial fibrillation. °· Taking medicine. Medicines may be given to control a rapid heart rate or to prevent blood  clots, heart failure, or a stroke. °· Having a procedure to correct the rhythm of the heart: °¨ Electrical cardioversion. During electrical cardioversion, a controlled, low-energy shock is delivered to the heart through your skin. If you have chest pain, very low blood pressure, or sudden heart failure, this procedure may need to be done as an emergency. °¨ Catheter ablation. During this procedure, heart tissues that send the signals that cause atrial fibrillation are destroyed. °¨ Surgical ablation. During this surgery, thin lines of heart tissue that carry the abnormal signals are destroyed. This procedure can either be an open-heart surgery or a minimally invasive surgery. With the minimally invasive surgery, small cuts are made to access the heart instead of a large opening. °¨ Pulmonary venous isolation. During this surgery, tissue around the veins that carry blood from the lungs (pulmonary veins) is destroyed. This tissue is thought to carry the abnormal signals. °HOME CARE INSTRUCTIONS  °· Take medicines only as directed by your health care provider. Some medicines can make atrial fibrillation worse or recur. °· If blood thinners were prescribed by your health care provider, take them exactly as directed. Too much blood-thinning medicine can cause bleeding. If you take too little, you will not have the needed protection against stroke and other problems. °· Perform blood tests at home if directed by your health care provider. Perform blood tests exactly as directed. °· Quit smoking if you smoke. °· Do not drink alcohol. °· Do not drink caffeinated beverages such as coffee, soda, and some teas. You may drink decaffeinated coffee, soda, or tea.   °· Maintain a healthy weight. Do not use diet pills unless your health care provider approves. They may make heart problems worse.   °· Follow diet instructions as directed by your health care provider. °· Exercise regularly as directed by your health care  provider. °· Keep all follow-up visits as directed by your health care provider. This is important. °PREVENTION  °The following substances can cause atrial fibrillation to recur:  °· Caffeinated beverages. °· Alcohol. °· Certain medicines, especially those used for breathing problems. °· Certain herbs and herbal medicines, such as those containing ephedra or ginseng. °· Illegal drugs, such as cocaine and amphetamines. °Sometimes medicines are given to prevent atrial fibrillation from recurring. Proper treatment of any underlying condition is also important in helping prevent recurrence.  °SEEK MEDICAL CARE IF: °· You notice a change in the rate, rhythm, or strength of your heartbeat. °· You suddenly begin urinating more frequently. °· You tire more easily when exerting yourself or exercising. °SEEK IMMEDIATE MEDICAL CARE IF:  °· You have chest pain, abdominal pain, sweating, or weakness. °· You feel nauseous. °· You have shortness of breath. °· You suddenly have swollen feet and ankles. °· You feel dizzy. °· Your face or limbs feel numb or weak. °· You have a change in your vision or speech. °MAKE SURE YOU:  °· Understand these instructions. °· Will watch your condition. °· Will get help right away if you are not doing well or get worse. °Document Released: 06/27/2005 Document Revised: 11/11/2013 Document Reviewed: 08/07/2012 °[employer]® Patient Information ©2015 [employer]. This information is not intended to replace advice given to you by your health care provider. Make sure you discuss any questions you have with your health care provider. ° °Anticoagulation, Generic °Anticoagulants are medicines   used to prevent clots from developing in your veins. These medicine are also known as blood thinners. If blood clots are untreated, they could travel to your lungs. This is called a pulmonary embolus. A blood clot in your lungs can be fatal.  °Health care providers often use anticoagulants to prevent clots following  surgery. Anticoagulants are also used along with aspirin when the heart is not getting enough blood. °Another anticoagulant called warfarin is started 2 to 3 days after a rapid-acting injectable anticoagulant is started. The rapid-acting anticoagulants are usually continued until warfarin has begun to work. Your health care provider will judge this length of time by blood tests known as the prothrombin time (PT) and International Normalization Ratio (INR). This means that your blood is at the necessary and best level to prevent clots. °RISKS AND COMPLICATIONS °· If you have received recent epidural anesthesia, spinal anesthesia, or a spinal tap while receiving anticoagulants, you are at risk for developing a blood clot in or around the spine. This condition could result in long-term or permanent paralysis. °· Because anticoagulants thin your blood, severe bleeding may occur from any tissue or organ. Symptoms of the blood being too thin may include: °¨ Bleeding from the nose or gums that does not stop quickly. °¨ Blood in bowel movements which may appear as bright red, dark, or black tarry stools. °¨ Blood in the urine which may appear as pink, red, or brown urine. °¨ Unusual bruising or bruising easily. °¨ A cut that does not stop bleeding within 10 minutes. °¨ Vomiting blood or continuous nausea for more than 1 day. °¨ Coughing up blood. °¨ Broken blood vessels in your eye (subconjunctival hemorrhage). °¨ Abdominal or back pain with or without flank bruising. °¨ Sudden, severe headache. °¨ Sudden weakness or numbness of the face, arm, or leg, especially on one side of the body. °¨ Sudden confusion. °¨ Trouble speaking (aphasia) or understanding. °¨ Sudden trouble seeing in one or both eyes. °¨ Sudden trouble walking. °¨ Dizziness. °¨ Loss of balance or coordination. °¨ Vaginal bleeding. °¨ Swelling or pain at an injection site. °¨ Superficial fat tissue death (necrosis) which may cause skin scarring. This is more  common in women and may first present as pain in the waist, thighs, or buttocks. °¨ Fever. °· Too little anticoagulation continues to allow the risk for blood clots. °HOME CARE INSTRUCTIONS  °· Due to the complications of anticoagulants, it is very important that you take your anticoagulant as directed by your health care provider. Anticoagulants need to be taken exactly as instructed. Be sure you understand all your anticoagulant instructions. °· Keep all follow-up appointments with your health care provider as directed. It is very important to keep your appointments. Not keeping appointments could result in a chronic or permanent injury, pain, or disability. °· Warfarin. Your health care provider will advise you on the length of treatment (usually 3-6 months, sometimes lifelong). °¨ Take warfarin exactly as directed by your health care provider. It is recommended that you take your warfarin dose at the same time of the day. It is preferred that you take warfarin in the late afternoon. If you have been told to stop taking warfarin, do not resume taking warfarin until directed to do so by your health care provider. Follow your health care provider's instructions if you accidentally take an extra dose or miss a dose of warfarin. It is very important to take warfarin as directed since bleeding or blood clots could result in   chronic or permanent injury, pain, or disability. °¨ Too much and too little warfarin are both dangerous. Too much warfarin increases the risk of bleeding. Too little warfarin continues to allow the risk for blood clots. While taking warfarin, you will need to have regular blood tests to measure your blood clotting time. These blood tests usually include both the prothrombin time (PT) and International Normalized Ratio (INR) tests. The PT and INR results allow your health care provider to adjust your dose of warfarin. The dose can change for many reasons. It is critically important that you have  your PT and INR levels drawn exactly as directed. Your warfarin dose may stay the same or change depending on what the PT and INR results are. Be sure to follow up with your health care provider regarding your PT and INR test results and what your warfarin dosage should be. °¨ Many medicines can interfere with warfarin and affect the PT and INR results. You must tell your health care provider about any and all medicines you take, this includes all vitamins and supplements. Ask your health care provider before taking these. Prescription and over-the-counter medicine consistency is critical to warfarin management. It is important that potential interactions are checked before you start a new medicine. Be especially cautious with aspirin and anti-inflammatory medicines. Ask your health care provider before taking these. Medicines such as antibiotics and acid-reducing medicine can interact with warfarin and can cause an increased warfarin effect. Warfarin can also interfere with the effectiveness of medicines you are taking. Do not take or discontinue any prescribed or over-the-counter medicine except on the advice of your health care provider or pharmacist. °¨ Some vitamins, supplements, and herbal products interfere with the effectiveness of warfarin. Vitamin E may increase the anticoagulant effects of warfarin. Vitamin K may can cause warfarin to be less effective. Do not take or discontinue any vitamin, supplement, or herbal product except on the advice of your health care provider or pharmacist. °¨ Eat what you normally eat and keep the vitamin K content of your diet consistent. Avoid major changes in your diet, or notify your health care provider before changing your diet. Suddenly getting a lot more vitamin K could cause your blood to clot too quickly. A sudden decrease in vitamin K intake could cause your blood to clot too slowly. These changes in vitamin K intake could lead to dangerous blood clots or to  bleeding. To keep your vitamin K intake consistent, you must be aware of which foods contain moderate or high amounts of vitamin K. Some foods high in vitamin K include spinach, kale, broccoli, cabbage, greens, Brussels sprouts, asparagus, [place], coleslaw, parsley, and green tea. Arrange a visit with a dietitian to answer your questions. °¨ If you have a loss of appetite or get the stomach flu (viral gastroenteritis), talk to your health care provider as soon as possible. A decrease in your normal vitamin K intake can make you more sensitive to your usual dose of warfarin. °¨ Some medical conditions may increase your risk for bleeding while you are taking warfarin. A fever, diarrhea lasting more than a day, worsening heart failure, or worsening liver function are some medical conditions that could affect warfarin. Contact your health care provider if you have any of these medical conditions. °¨ Alcohol can change the body's ability to handle warfarin. It is best to avoid alcoholic drinks or consume only very small amounts while taking warfarin. Notify your health care provider if you change your alcohol   intake. A sudden increase in alcohol use can increase your risk of bleeding. Chronic alcohol use can cause warfarin to be less effective. °· Be careful not to cut yourself when using sharp objects or while shaving. °· Inform all your health care providers and your dentist that you take an anticoagulant. °· Limit physical activities or sports that could result in a fall or cause injury. Avoid contact sports. °· Wear medical alert jewelry or carry a medical alert card. °SEEK IMMEDIATE MEDICAL CARE IF: °· You cough up blood. °· You have dark or black stools or there is bright red blood coming from your rectum. °· You vomit blood or have nausea for more than 1 day. °· You have blood in the urine or pink colored urine. °· You have unusual bruising or have increased bruising. °· You have bleeding from the nose or gums  that does not stop quickly. °· You have a cut that does not stop bleeding within a 2-3 minutes. °· You have sudden weakness or numbness of the face, arm, or leg, especially on one side of the body. °· You have sudden confusion. °· You have trouble speaking (aphasia) or understanding. °· You have sudden trouble seeing in one or both eyes. °· You have sudden trouble walking. °· You have dizziness. °· You have a loss of balance or coordination. °· You have a sudden, severe headache. °· You have a serious fall or head injury, even if you are not bleeding. °· You have swelling or pain at an injection site. °· You have unexplained tenderness or pain in the abdomen, back, waist, thighs or buttocks. °· You have a fever. °Any of these symptoms may represent a serious problem that is an emergency. Do not wait to see if the symptoms will go away. Get medical help right away. Call your local emergency services (911 in [place] Do not drive yourself to the hospital. °Document Released: 06/27/2005 Document Revised: 07/02/2013 Document Reviewed: 01/30/2008 °[employer]® Patient Information ©2015 [employer]. This information is not intended to replace advice given to you by your health care provider. Make sure you discuss any questions you have with your health care provider. ° °

## 2015-03-22 NOTE — ED Notes (Signed)
Patient with onset of palpitations last night.  She tried to rest.  Today she continues to have palpitations.  Patient does have hx of afib.  Patient also has copd.  Her sob is worse today.  She is alert and oriented.  Family is at bedside.

## 2015-03-22 NOTE — ED Provider Notes (Signed)
CSN: [id number]     Arrival date & time 03/22/15  4098 History   First MD Initiated Contact with Patient 03/22/15 [phone]     Chief Complaint  Patient presents with  . Shortness of Breath  . Palpitations     (Consider location/radiation/quality/duration/timing/severity/associated sxs/prior Treatment) Patient is a 64 y.o. female presenting with palpitations. The history is provided by the patient.  Palpitations Palpitations quality:  Irregular Onset quality:  Sudden Duration:  8 hours Timing:  Constant Progression:  Unchanged Chronicity:  Recurrent Relieved by:  Nothing Worsened by:  Nothing Ineffective treatments:  None tried Associated symptoms: shortness of breath and weakness   Associated symptoms: no chest pain, no syncope and no vomiting   Risk factors: hx of atrial fibrillation     Past Medical History  Diagnosis Date  . Bronchitis   . COPD (chronic obstructive pulmonary disease)   . PAF (paroxysmal atrial fibrillation)     a. 09/2013 Echo: EF 55-60%, gr 1 DD, mild MR, mildly dil RA;  b. 09/2013 successful DCCV.  . Moderate alcohol use disorder    Past Surgical History  Procedure Laterality Date  . Cholecystectomy    . Abdominal hysterectomy    . Appendectomy    . Nasal sinus surgery    . Cardioversion N/A 10/03/2013    Procedure: CARDIOVERSION    (BEDSIDE) ;  Surgeon: Cassell Clement, MD;  Location: [facility] OR;  Service: Cardiovascular;  Laterality: N/A;   Family History  Problem Relation Age of Onset  . Lung cancer Father     smoked  . Cirrhosis Mother   . Atrial fibrillation Brother    Social History  Substance Use Topics  . Smoking status: Former Smoker -- 1.00 packs/day for 45 years    Types: Cigarettes    Quit date: 04/18/2013  . Smokeless tobacco: Never Used  . Alcohol Use: 0.0 oz/week     Comment: vodka cocktail 4-5 times per wk   OB History    No data available     Review of Systems  Respiratory: Positive for shortness of breath.    Cardiovascular: Positive for palpitations. Negative for chest pain and syncope.  Gastrointestinal: Negative for vomiting.  Neurological: Positive for weakness.  All other systems reviewed and are negative.     Allergies  Morphine and related  Home Medications   Prior to Admission medications   Medication Sig Start Date End Date Taking? Authorizing Provider  albuterol (VENTOLIN HFA) 108 (90 BASE) MCG/ACT inhaler Inhale 2 puffs into the lungs every 6 (six) hours as needed for wheezing or shortness of breath.   Yes Historical Provider, MD  FOLIC ACID PO Take 1 tablet by mouth daily.    Yes Historical Provider, MD  ibuprofen (ADVIL,MOTRIN) 800 MG tablet Take 1 tablet (800 mg total) by mouth every 8 (eight) hours as needed for mild pain or moderate pain. 10/11/14  Yes Trixie Dredge, PA-C  Multiple Vitamins-Minerals (WOMENS ONE DAILY) TABS Take 1 tablet by mouth daily.    Yes Historical Provider, MD  SYMBICORT 160-4.5 MCG/ACT inhaler INHALE 2 PUFFS FIRST THING IN THE MORNING, INHALE ANOTHER 2 PUFFS ABOUT 12HOURS LATER 03/11/15  Yes Nyoka Cowden, MD  diltiazem (CARDIZEM CD) 120 MG 24 hr capsule Take 1 capsule (120 mg total) by mouth daily. Patient not taking: Reported on 03/22/2015 10/22/13   Rollene Rotunda, MD   BP 116/72 mmHg  Pulse 96  Resp 21  SpO2 96% Physical Exam  Constitutional: She is oriented to person, place,  and time. She appears well-developed and well-nourished. No distress.  HENT:  Head: Normocephalic.  Eyes: Conjunctivae are normal.  Neck: Neck supple. No tracheal deviation present.  Cardiovascular: An irregularly irregular rhythm present. Tachycardia present.   Pulmonary/Chest: Effort normal and breath sounds normal. No respiratory distress.  Abdominal: Soft. She exhibits no distension.  Neurological: She is alert and oriented to person, place, and time.  Skin: Skin is warm and dry.  Psychiatric: She has a normal mood and affect.    ED Course  Procedures (including  critical care time) Procedural sedation Performed by: Lyndal Pulley Consent: Verbal consent obtained. Risks and benefits: risks, benefits and alternatives were discussed Required items: required blood products, implants, devices, and special equipment available Patient identity confirmed: arm band and provided demographic data Time out: Immediately prior to procedure a "time out" was called to verify the correct patient, procedure, equipment, support staff and site/side marked as required.  Sedation type: moderate (conscious) sedation NPO time confirmed and considedered  Sedatives: ETOMIDATE  Physician Time at Bedside: 30  Vitals: Vital signs were monitored during sedation. Cardiac Monitor, pulse oximeter Patient tolerance: Patient tolerated the procedure well with no immediate complications. Comments: Pt with uneventful recovered. Returned to pre-procedural sedation baseline   CRITICAL CARE Performed by: Lyndal Pulley Total critical care time: 30 Critical care time was exclusive of separately billable procedures and treating other patients. Critical care was necessary to treat or prevent imminent or life-threatening deterioration. Critical care was time spent personally by me on the following activities: development of treatment plan with patient and/or surrogate as well as nursing, discussions with consultants, evaluation of patient's response to treatment, examination of patient, obtaining history from patient or surrogate, ordering and performing treatments and interventions, ordering and review of laboratory studies, ordering and review of radiographic studies, pulse oximetry and re-evaluation of patient's condition.   Labs Review Labs Reviewed  BASIC METABOLIC PANEL - Abnormal; Notable for the following:    Glucose, Bld 165 (*)    All other components within normal limits  CBC WITH DIFFERENTIAL/PLATELET - Abnormal; Notable for the following:    WBC 10.6 (*)    Platelets 426  (*)    All other components within normal limits  HEPARIN LEVEL (UNFRACTIONATED)  I-STAT TROPOININ, ED    Imaging Review No results found. I have personally reviewed and evaluated these images and lab results as part of my medical decision-making.   EKG Interpretation   Date/Time:  Sunday March 22 2015 08:33:59 EDT Ventricular Rate:  158 PR Interval:    QRS Duration: 70 QT Interval:  278 QTC Calculation: 450 R Axis:   40 Text Interpretation:  Atrial fibrillation with rapid ventricular response  Abnormal ECG Confirmed by Micca Matura MD, Lyal Husted ([id number]) on 03/22/2015 8:50:05 AM      MDM   Final diagnoses:  Atrial fibrillation with RVR   64 y.o. female with h/o intermittent a-fib presents with recurrent onset 8 hrs PTA, started suddenly. Not anticoagulated. In symptomatic RVR with SOB and fatigue. No significant electrolyte disturbance and no ischemic etiology likely with negative troponin. Discussed cardioversion with Pt as option for management and she declined opting for medical management. Rate controlled with cardizem bolus and drip, cardiology consulted and will see Pt in ED. After discussing with cardiologist Pt wishes to go forward with cardioversion and anticoagulation as outpatient. Sedated and cardioverted by cardiologist at bedside. Uncomplicated and Pt able to ambulate and     Lyndal Pulley, MD 03/22/15 2037

## 2015-03-22 NOTE — ED Notes (Signed)
Pt ambulated per verbal order. Pt ambulated without difficulty.

## 2015-03-22 NOTE — Consult Note (Addendum)
CARDIOLOGY CONSULT NOTE   Patient ID: Kathy Frost MRN: [id number] DOB/AGE: 1951/02/09 64 y.o.  Admit date: 03/22/2015  Primary Physician   Lupita Raider, MD Primary Cardiologist   Dr. Antoine Poche  Reason for Consultation  afib with RVR.  HPI: Kathy Frost is a 64 y.o. female with a history of COPD, alcoholism and PAF who presented to [facility] ED today with recurrent afib with RVR.  She was first diagnosed with afib during 09/2013 admission. She underwent successful DCCV and was placed on 4 weeks of AC with Eliquis. This was discontinued long term due to low thrombotic risk. She was sent home on Cardizem CD 240mg  and Lopressor 25mg  BID.  2D ECHO demonstrated a normal EF and no significant valvular abnormalities. She was then seen by Dr. Antoine Poche in the office in 10/2013 for follow up and complained of fatigue. He reduced Cardizem CD to 120 mg daily and stopped the beta blocker. She has done well since that time. She has not been taking any AV nodal blockers.   She was in her usual state of health until last night when she noted "not feeling good." She felt palpations and weakness and knew she was back in afib. She denies any recent illness, coughs or colds. She "worked hard" yesterday helping her daughter with cooking and cleaning. She had two mixed drinks yesterday which is no more than usual. She no longer smoke cigarettes. She presented to the [facility] ED this AM with complaints of palpitations. She is feeling much better since being started on cardizem gtt, but she can still feel her heart fluttering. When questioned further about onset of symptoms she stated that she may have felt a brief fluttering in her chest during the work week while sitting at her desk, but it was very short lived. No LE edema, orhopnea or PND. She always sleeps on two pillows due to her COPD and has chronic dyspnea.    Past Medical History  Diagnosis Date  . Bronchitis   . COPD (chronic obstructive pulmonary  disease)   . PAF (paroxysmal atrial fibrillation)     a. 09/2013 Echo: EF 55-60%, gr 1 DD, mild MR, mildly dil RA;  b. 09/2013 successful DCCV.  . Moderate alcohol use disorder      Past Surgical History  Procedure Laterality Date  . Cholecystectomy    . Abdominal hysterectomy    . Appendectomy    . Nasal sinus surgery    . Cardioversion N/A 10/03/2013    Procedure: CARDIOVERSION    (BEDSIDE) ;  Surgeon: Cassell Clement, MD;  Location: [facility] OR;  Service: Cardiovascular;  Laterality: N/A;    Allergies  Allergen Reactions  . Morphine And Related     Sick on stomach     I have reviewed the patient's current medications   . diltiazem (CARDIZEM) infusion 5 mg/hr (03/22/15 0924)  . heparin 850 Units/hr (03/22/15 1118)     Prior to Admission medications   Medication Sig Start Date End Date Taking? Authorizing Provider  albuterol (VENTOLIN HFA) 108 (90 BASE) MCG/ACT inhaler Inhale 2 puffs into the lungs every 6 (six) hours as needed for wheezing or shortness of breath.   Yes Historical Provider, MD  FOLIC ACID PO Take 1 tablet by mouth daily.    Yes Historical Provider, MD  ibuprofen (ADVIL,MOTRIN) 800 MG tablet Take 1 tablet (800 mg total) by mouth every 8 (eight) hours as needed for mild pain or moderate pain. 10/11/14  Yes Irving Burton  Chad, PA-C  Multiple Vitamins-Minerals (WOMENS ONE DAILY) TABS Take 1 tablet by mouth daily.    Yes Historical Provider, MD  SYMBICORT 160-4.5 MCG/ACT inhaler INHALE 2 PUFFS FIRST THING IN THE MORNING, INHALE ANOTHER 2 PUFFS ABOUT 12HOURS LATER 03/11/15  Yes Nyoka Cowden, MD  diltiazem (CARDIZEM CD) 120 MG 24 hr capsule Take 1 capsule (120 mg total) by mouth daily. Patient not taking: Reported on 03/22/2015 10/22/13   Rollene Rotunda, MD     Social History   Social History  . Marital Status: Married    Spouse Name: N/A  . Number of Children: 1  . Years of Education: N/A   Occupational History  . Clerical work    Social History Main Topics  . Smoking  status: Former Smoker -- 1.00 packs/day for 45 years    Types: Cigarettes    Quit date: 04/18/2013  . Smokeless tobacco: Never Used  . Alcohol Use: 0.0 oz/week     Comment: vodka cocktail 4-5 times per wk  . Drug Use: No  . Sexual Activity: Yes   Other Topics Concern  . Not on file   Social History Narrative   =             Family Status  Relation Status Death Age  . Mother Deceased   . Brother Alive   . Daughter Alive   . Brother Alive    Family History  Problem Relation Age of Onset  . Lung cancer Father     smoked  . Cirrhosis Mother   . Atrial fibrillation Brother      ROS:  Full 14 point review of systems complete and found to be negative unless listed above.  Physical Exam: Blood pressure 118/81, pulse 105, resp. rate 18, SpO2 96 %.  General: Well developed, well nourished, female in no acute distress Head: Eyes PERRLA, No xanthomas.   Normocephalic and atraumatic, oropharynx without edema or exudate.  Lungs: CTAB Heart: HRRR S1 S2, no rub/gallop, Heart irregular rate and rhythm with S1, S2  murmur. pulses are 2+ extrem.   Neck: No carotid bruits. No lymphadenopathy.  No JVD. Abdomen: Bowel sounds present, abdomen soft and non-tender without masses or hernias noted. Msk:  No spine or cva tenderness. No weakness, no joint deformities or effusions. Extremities: No clubbing or cyanosis. No edema.  Neuro: Alert and oriented X 3. No focal deficits noted. Psych:  Good affect, responds appropriately Skin: No rashes or lesions noted.  Labs:   Lab Results  Component Value Date   WBC 10.6* 03/22/2015   HGB 14.7 03/22/2015   HCT 44.1 03/22/2015   MCV 93.8 03/22/2015   PLT 426* 03/22/2015   No results for input(s): INR in the last 72 hours.   Recent Labs Lab 03/22/15 0839  NA 136  K 4.0  CL 101  CO2 24  BUN 12  CREATININE 0.73  CALCIUM 9.5  GLUCOSE 165*   MAGNESIUM  Date Value Ref Range Status  10/04/2013 1.9 1.5 - 2.5 mg/dL Final   No results  for input(s): CKTOTAL, CKMB, TROPONINI in the last 72 hours.  Recent Labs  03/22/15 0910  TROPIPOC 0.00    Echo: LV EF: 55% -   60% Study Conclusions - Left ventricle: The cavity size was normal. Wall thickness   was increased in a pattern of mild LVH. Systolic function   was normal. The estimated ejection fraction was in the   range of 55% to 60%. Wall motion was normal;  there were no   regional wall motion abnormalities. Doppler parameters are   consistent with abnormal left ventricular relaxation   (grade 1 diastolic dysfunction). - Mitral valve: Mild regurgitation. - Right atrium: The atrium was mildly dilated.   ECG:  HR 158: Atrial fibrillation with rapid ventricular response  Radiology:  No results found.  ASSESSMENT AND PLAN:    Principal Problem:   Atrial fibrillation with RVR Active Problems:   PAF (paroxysmal atrial fibrillation)   COPD (chronic obstructive pulmonary disease)   Moderate alcohol use disorder   Kathy Frost is a 64 y.o. female with a history of COPD, alcoholism and PAF who presented to [facility] ED today with recurrent afib with RVR.  Afib with RVR- feeling much better after cardizem gtt. Plan is for DCCV in the ED and discharge home with 30 days of Eliquis. She will have follow up in the afib clinic arranged. We discussed how heavy alcohol use can cause PAF. She understands.  -- CHADSVASC score 1 for female sex. She has no hx of CHF, DM, HTN or vascular dz. She is <65 yo. Likely only has to take Eliquis for 30 days after DCCV.  COPD- continue home meds. No evidence of acute exacerbation    Signed: Janetta Hora, PA-C 03/22/2015 2:07 PM  Pager [phone]  Co-Sign MD  I have seen, examined the patient, and reviewed the above assessment and plan.  On exam, iRRR.  No CHF or ischemic symptoms.  Clear onset of AF is <24 hours.  Risks, benefits, and alternatives to cardioversion discussed with patient who wishes to proceed.  Changes to above are  made where necessary.    Co Sign: Hillis Range, MD 03/22/2015 3:12 PM  Successfully cardioverted to sinus Discharge to home eliquis  BID x 30 days Follow-up in AF clinic later this week (I will arrange)  Hillis Range MD, [facility] 03/22/2015 3:40 PM

## 2015-03-23 MED FILL — Medication: Qty: 1 | Status: AC

## 2015-03-26 ENCOUNTER — Encounter (HOSPITAL_COMMUNITY): Payer: Self-pay | Admitting: Nurse Practitioner

## 2015-03-26 ENCOUNTER — Ambulatory Visit (HOSPITAL_COMMUNITY)
Admission: RE | Admit: 2015-03-26 | Discharge: 2015-03-26 | Disposition: A | Discharge status: Home / Self Care | Payer: BLUE CROSS/BLUE SHIELD | Source: Ambulatory Visit | Attending: Nurse Practitioner | Admitting: Nurse Practitioner

## 2015-03-26 VITALS — BP 112/74 | HR 86 | Ht 68.0 in | Wt 159.4 lb

## 2015-03-26 DIAGNOSIS — I4891 Unspecified atrial fibrillation: Secondary | ICD-10-CM | POA: Diagnosis not present

## 2015-03-26 DIAGNOSIS — I480 Paroxysmal atrial fibrillation: Secondary | ICD-10-CM | POA: Diagnosis not present

## 2015-03-26 MED ORDER — DILTIAZEM HCL 30 MG PO TABS: ORAL_TABLET | ORAL | Status: AC

## 2015-03-26 NOTE — Patient Instructions (Signed)
Your physician has recommended you make the following change in your medication:  1)Cardizem  -- take 1 tablet every 4 hours AS NEEDED for afib heart rate >100 as long as BP>100  Parking code 0900

## 2015-03-26 NOTE — Progress Notes (Signed)
Patient ID: Kathy Frost, female   DOB: February 15, 1951, 64 y.o.   MRN: [id number]     Primary Care Physician: Lupita Raider, MD Referring Physician: [facility] f/u   Kathy Frost is a 64 y.o. female with a h/o PAF x one episode one year ago, maintaining SR, until presented with afib with RVR, successful DCCV in the ER 9/11, in the afib clinic for f/u. She had been on daily CCB/BB which made her feel bad due to fatigue. She was eventually weaned off medication last spring. The pt feels that she went to a few parties over the weekend and possibly alcohol was the trigger. Review of other risk factors is negative for snoring, tobacco(stopped smoking 2 years ago),obesity. Does walk when the weather is nice. She does have COPD.   Discussed with pt options to maintain SR. At this point would recommend multaq or flecainde.  She does not like daily pills and believes if she limits alcohol, she will prevent reoccurrences of afib. She is on eliquis x 30 days s/p DCCV, for a chadsvasc score of 1(female) and then can stop  blood thinner. She was rapid with a HR around 160 in the ER so I discussed with her pill in pocket cardizem 30 mg to use as needed for afib.  Today, she denies symptoms of palpitations, chest pain, shortness of breath, orthopnea, PND, lower extremity edema, dizziness, presyncope, syncope, or neurologic sequela. The patient is tolerating medications without difficulties and is otherwise without complaint today.   Past Medical History  Diagnosis Date  . Bronchitis   . COPD (chronic obstructive pulmonary disease)   . PAF (paroxysmal atrial fibrillation)     a. 09/2013 Echo: EF 55-60%, gr 1 DD, mild MR, mildly dil RA;  b. 09/2013 successful DCCV.  . Moderate alcohol use disorder    Past Surgical History  Procedure Laterality Date  . Cholecystectomy    . Abdominal hysterectomy    . Appendectomy    . Nasal sinus surgery    . Cardioversion N/A 10/03/2013    Procedure: CARDIOVERSION    (BEDSIDE) ;   Surgeon: Cassell Clement, MD;  Location: [facility] OR;  Service: Cardiovascular;  Laterality: N/A;    Current Outpatient Prescriptions  Medication Sig Dispense Refill  . apixaban (ELIQUIS) 5 MG TABS tablet Take 1 tablet (5 mg total) by mouth 2 (two) times daily. 60 tablet 0  . FOLIC ACID PO Take 1 tablet by mouth daily.     Marland Kitchen ibuprofen (ADVIL,MOTRIN) 800 MG tablet Take 1 tablet (800 mg total) by mouth every 8 (eight) hours as needed for mild pain or moderate pain. 15 tablet 0  . Multiple Vitamins-Minerals (WOMENS ONE DAILY) TABS Take 1 tablet by mouth daily.     . SYMBICORT 160-4.5 MCG/ACT inhaler INHALE 2 PUFFS FIRST THING IN THE MORNING, INHALE ANOTHER 2 PUFFS ABOUT 12HOURS LATER 10.2 g 0  . diltiazem (CARDIZEM) 30 MG tablet Take 1 tablet every 4 hours AS NEEDED for afib HR>100 as long as BP>100 30 tablet 1   No current facility-administered medications for this encounter.    Allergies  Allergen Reactions  . Morphine And Related     Sick on stomach     Social History   Social History  . Marital Status: Married    Spouse Name: N/A  . Number of Children: 1  . Years of Education: N/A   Occupational History  . Clerical work    Social History Main Topics  . Smoking status: Former  Smoker -- 1.00 packs/day for 45 years    Types: Cigarettes    Quit date: 04/18/2013  . Smokeless tobacco: Never Used  . Alcohol Use: 0.0 oz/week     Comment: vodka cocktail 4-5 times per wk  . Drug Use: No  . Sexual Activity: Yes   Other Topics Concern  . Not on file   Social History Narrative   =             Family History  Problem Relation Age of Onset  . Lung cancer Father     smoked  . Cirrhosis Mother   . Atrial fibrillation Brother     ROS- All systems are reviewed and negative except as per the HPI above  Physical Exam: Filed Vitals:   03/26/15 1326  BP: 112/74  Pulse: 86  Height: 5\' 8"  (1.727 m)  Weight: 159 lb 6.4 oz (72.303 kg)    GEN- The patient is well appearing,  alert and oriented x 3 today.   Head- normocephalic, atraumatic Eyes-  Sclera clear, conjunctiva pink Ears- hearing intact Oropharynx- clear Neck- supple, no JVP Lymph- no cervical lymphadenopathy Lungs- Clear to ausculation bilaterally, normal work of breathing Heart- Regular rate and rhythm, no murmurs, rubs or gallops, PMI not laterally displaced GI- soft, NT, ND, + BS Extremities- no clubbing, cyanosis, or edema MS- no significant deformity or atrophy Skin- no rash or lesion Psych- euthymic mood, full affect Neuro- strength and sensation are intact  EKG-NSR 86 bpm, PR int 140 ms, QRS int 84 ms, QTc 449 ms Epic records reviewed Echo-- Left ventricle: The cavity size was normal. Wall thickness was increased in a pattern of mild LVH. Systolic function was normal. The estimated ejection fraction was in the range of 55% to 60%. Wall motion was normal; there were no regional wall motion abnormalities. Doppler parameters are consistent with abnormal left ventricular relaxation (grade 1 diastolic dysfunction). - Mitral valve: Mild regurgitation. - Right atrium: The atrium was mildly dilated.  Assessment and Plan:  1. PAF S/P successful DCCV Finish 30 days of eliquis and then stop with chadsvasc of 1 Offered AAD therapy today but she declined Wants pill in pocket cardizem 30 mg to use for afib episodes  2. Lifestyle risk factors Limit alcohol Regular exercise encouraged  F/u in afib clinic in 4-6 weeks.  Elvina Sidle [facility] [address] [place], [place] [address] [phone]

## 2015-04-20 IMAGING — CR DG CHEST 2V
2 series · 2 of 2 positions shown · non-contrast
Comparison: Single view of the chest 10/01/2013. PA and lateral
chest 04/21/2013.

CLINICAL DATA: COPD.

EXAM:
CHEST  2 VIEW

[view not recorded (1 of 2)]
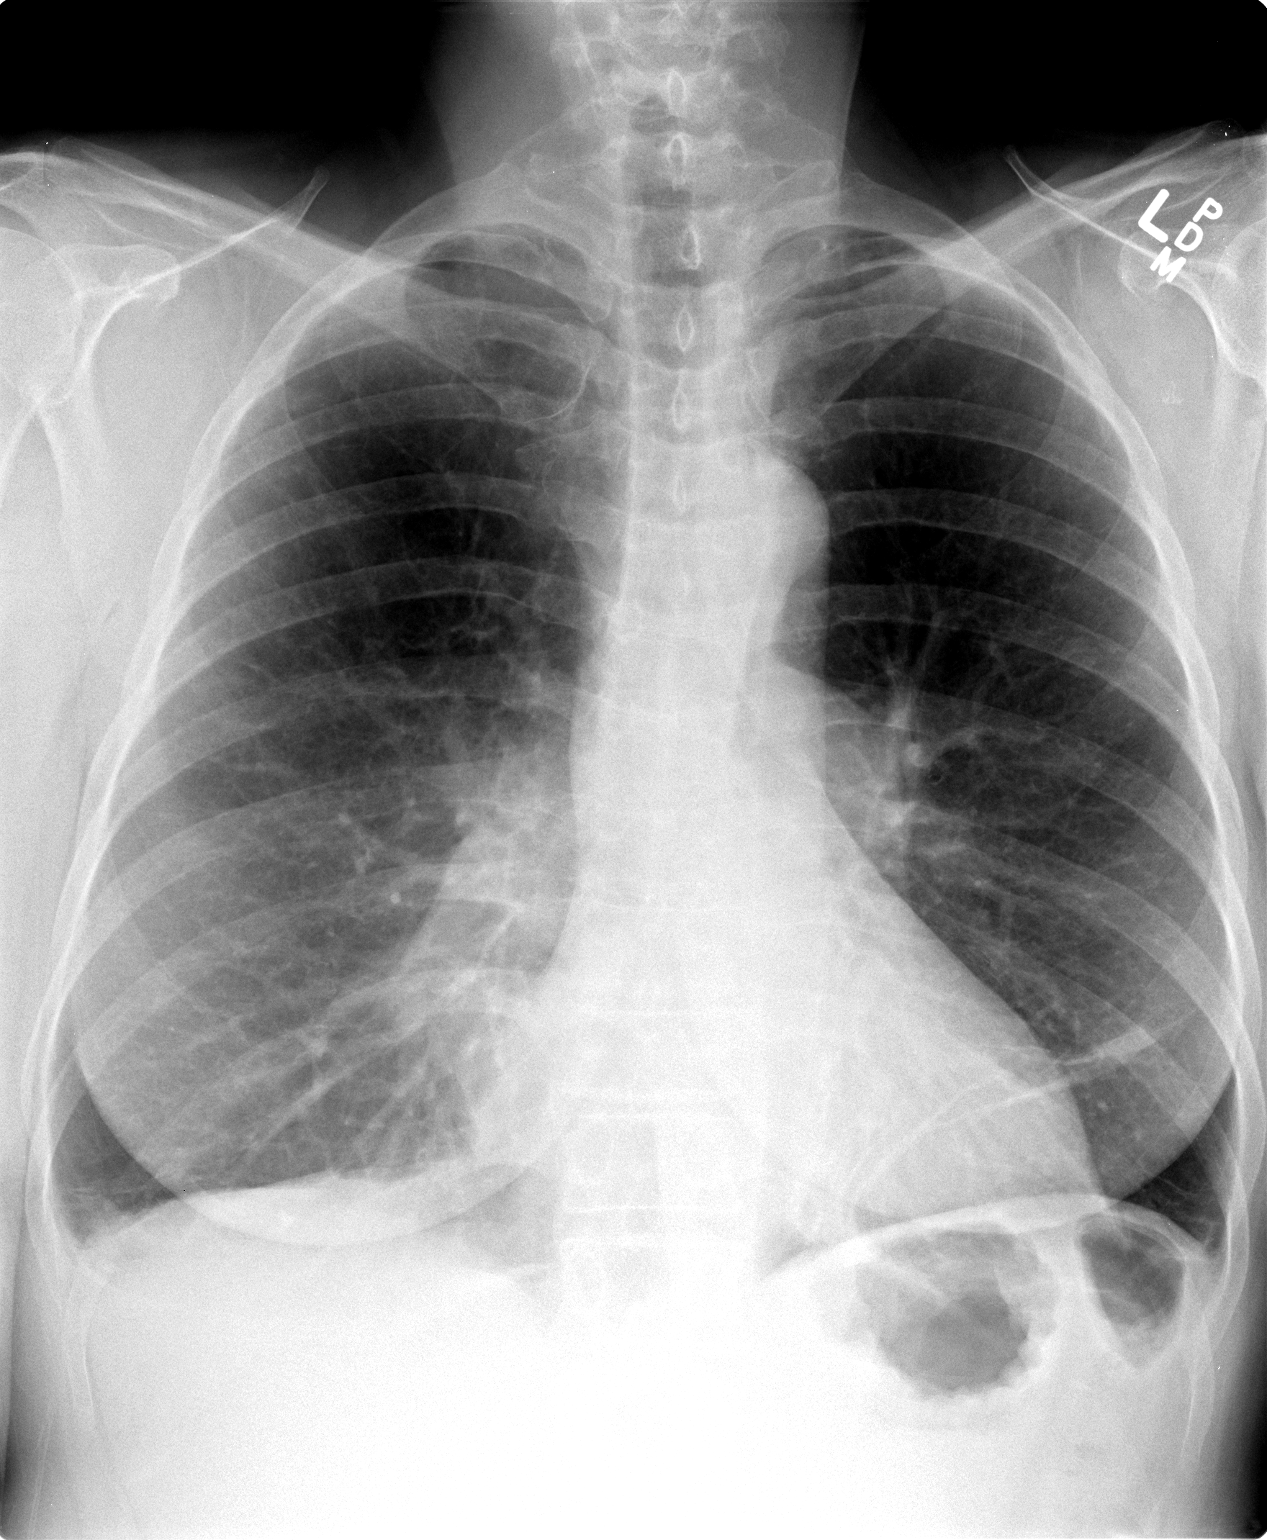

[view not recorded (2 of 2)]
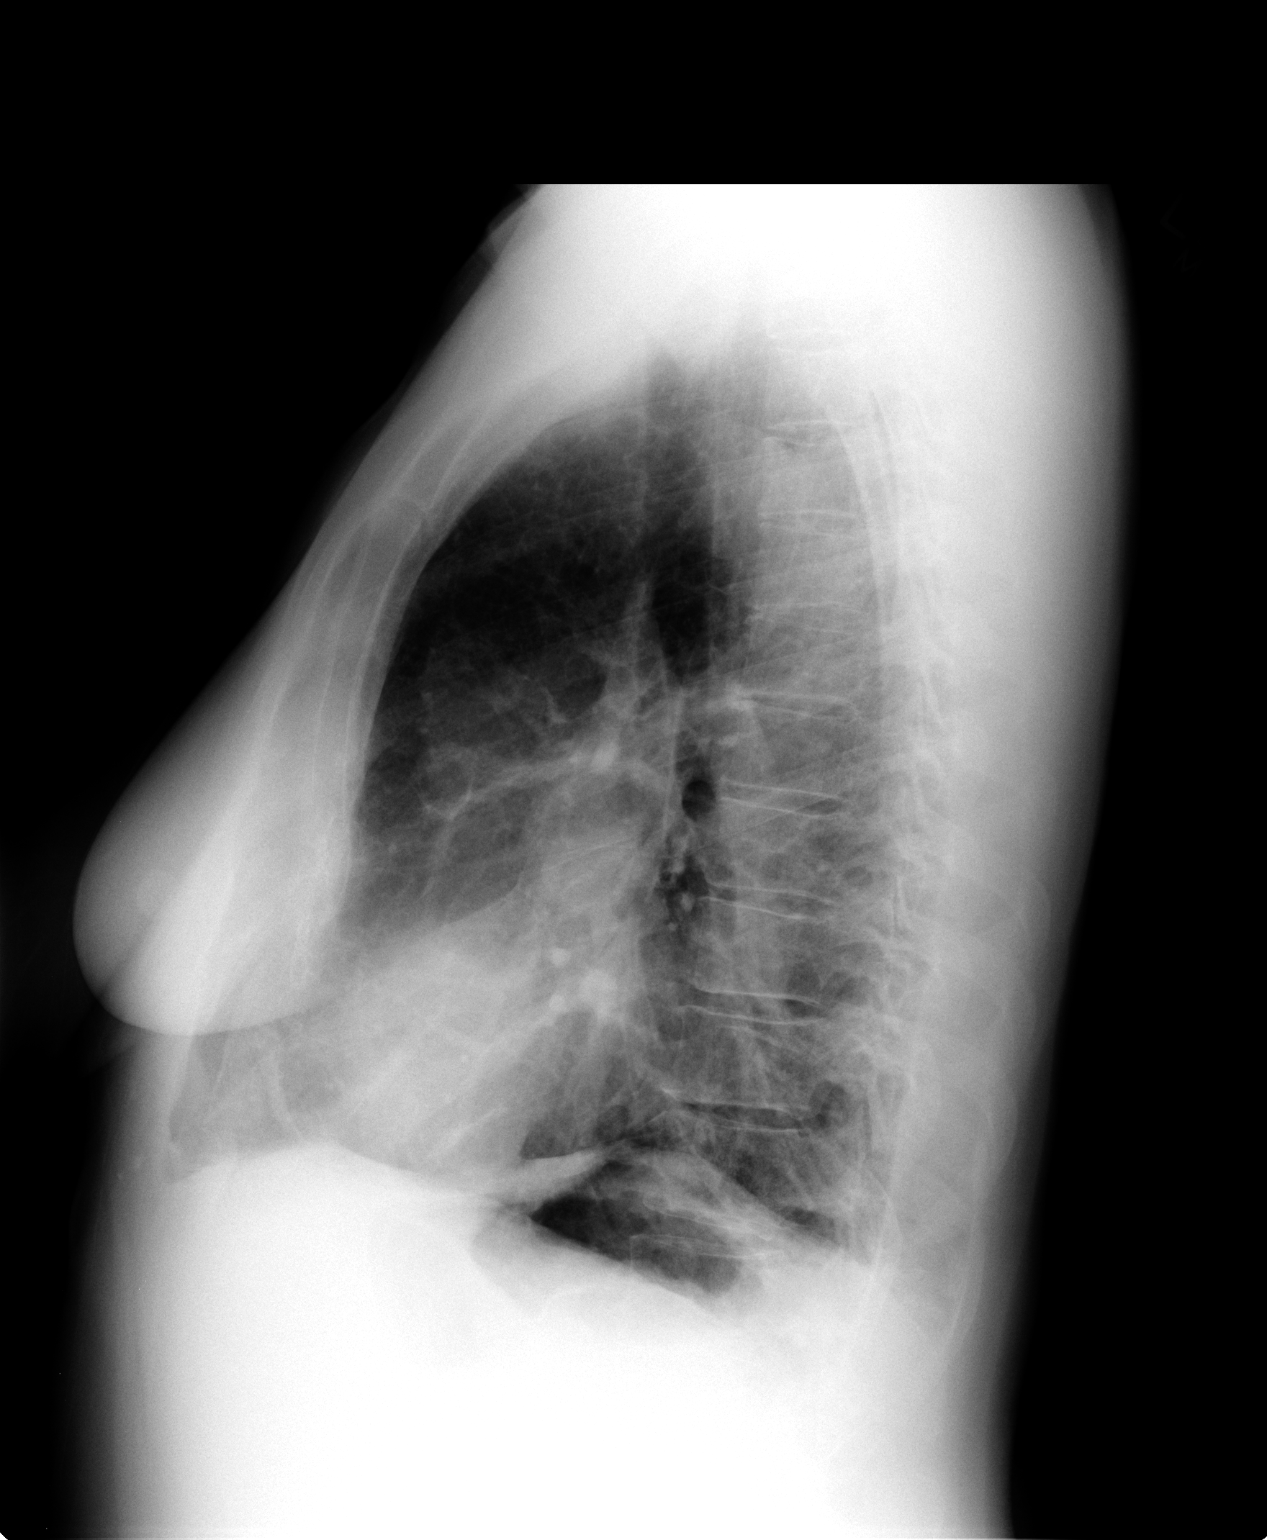

[2 of 2 positions shown; findings below may reference images not displayed]

FINDINGS: Linear atelectasis or scar is seen in the lung bases. The chest is
hyperexpanded. There is no pneumothorax or pleural effusion. Heart
size is upper normal.
IMPRESSION: No acute disease.

Pulmonary hyperexpansion compatible with emphysema.

## 2015-04-29 ENCOUNTER — Inpatient Hospital Stay (HOSPITAL_COMMUNITY)
Admission: RE | Admit: 2015-04-29 | Payer: BLUE CROSS/BLUE SHIELD | Source: Ambulatory Visit | Admitting: Nurse Practitioner

## 2015-04-30 ENCOUNTER — Ambulatory Visit (HOSPITAL_COMMUNITY)
Admission: RE | Admit: 2015-04-30 | Discharge: 2015-04-30 | Disposition: A | Discharge status: Home / Self Care | Payer: BLUE CROSS/BLUE SHIELD | Source: Ambulatory Visit | Attending: Nurse Practitioner | Admitting: Nurse Practitioner

## 2015-04-30 ENCOUNTER — Encounter (HOSPITAL_COMMUNITY): Payer: Self-pay | Admitting: Nurse Practitioner

## 2015-04-30 VITALS — BP 104/76 | HR 80 | Ht 67.0 in | Wt 157.6 lb

## 2015-04-30 DIAGNOSIS — I480 Paroxysmal atrial fibrillation: Secondary | ICD-10-CM | POA: Diagnosis present

## 2015-04-30 NOTE — Progress Notes (Signed)
Patient ID: Kathy RousselKathy L Frost, female   DOB: 05-23-1951, 64 y.o.   MRN: [id number]    Primary Care Physician: Lupita RaiderSHAW,KIMBERLEE, MD Referring Physician: Dr. Irish EldersAllred   Kathy Frost is a 64 y.o. female with a h/o afib that is here for f/u of PAF. Since last visit in the afib clinic, she has not had any further afib episodes. She is trying to exercise more, drinking less alcohol.   Today, she denies symptoms of palpitations, chest pain, shortness of breath, orthopnea, PND, lower extremity edema, dizziness, presyncope, syncope, or neurologic sequela. The patient is tolerating medications without difficulties and is otherwise without complaint today.   Past Medical History  Diagnosis Date  . Bronchitis   . COPD (chronic obstructive pulmonary disease) (HCC)   . PAF (paroxysmal atrial fibrillation) (HCC)     a. 09/2013 Echo: EF 55-60%, gr 1 DD, mild MR, mildly dil RA;  b. 09/2013 successful DCCV.  . Moderate alcohol use disorder [facility](HCC)    Past Surgical History  Procedure Laterality Date  . Cholecystectomy    . Abdominal hysterectomy    . Appendectomy    . Nasal sinus surgery    . Cardioversion N/A 10/03/2013    Procedure: CARDIOVERSION    (BEDSIDE) ;  Surgeon: Cassell Clementhomas Brackbill, MD;  [facility];  Service: Cardiovascular;  Laterality: N/A;    Current Outpatient Prescriptions  Medication Sig Dispense Refill  . FOLIC ACID PO Take 1 tablet by mouth daily.     Marland Kitchen. ibuprofen (ADVIL,MOTRIN) 800 MG tablet Take 1 tablet (800 mg total) by mouth every 8 (eight) hours as needed for mild pain or moderate pain. 15 tablet 0  . Multiple Vitamins-Minerals (WOMENS ONE DAILY) TABS Take 1 tablet by mouth daily.     . SYMBICORT 160-4.5 MCG/ACT inhaler INHALE 2 PUFFS FIRST THING IN THE MORNING, INHALE ANOTHER 2 PUFFS ABOUT 12HOURS LATER 10.2 g 0  . diltiazem (CARDIZEM) 30 MG tablet Take 1 tablet every 4 hours AS NEEDED for afib HR>100 as long as BP>100 (Patient not taking: Reported on 04/30/2015) 30 tablet 1   No  current facility-administered medications for this encounter.    Allergies  Allergen Reactions  . Morphine And Related     Sick on stomach     Social History   Social History  . Marital Status: Married    Spouse Name: N/A  . Number of Children: 1  . Years of Education: N/A   Occupational History  . Clerical work    Social History Main Topics  . Smoking status: Former Smoker -- 1.00 packs/day for 45 years    Types: Cigarettes    Quit date: 04/18/2013  . Smokeless tobacco: Never Used  . Alcohol Use: 0.0 oz/week     Comment: vodka cocktail 4-5 times per wk  . Drug Use: No  . Sexual Activity: Yes   Other Topics Concern  . Not on file   Social History Narrative   =             Family History  Problem Relation Age of Onset  . Lung cancer Father     smoked  . Cirrhosis Mother   . Atrial fibrillation Brother     ROS- All systems are reviewed and negative except as per the HPI above  Physical Exam: Filed Vitals:   04/30/15 1058  BP: 104/76  Pulse: 80  Height: 5\' 7"  (1.702 m)  Weight: 157 lb 9.6 oz (71.487 kg)    GEN- The patient  is well appearing, alert and oriented x 3 today.   Head- normocephalic, atraumatic Eyes-  Sclera clear, conjunctiva pink Ears- hearing intact Oropharynx- clear Neck- supple, no JVP Lymph- no cervical lymphadenopathy Lungs- Clear to ausculation bilaterally, normal work of breathing Heart- Regular rate and rhythm, no murmurs, rubs or gallops, PMI not laterally displaced GI- soft, NT, ND, + BS Extremities- no clubbing, cyanosis, or edema MS- no significant deformity or atrophy Skin- no rash or lesion Psych- euthymic mood, full affect Neuro- strength and sensation are intact  EKG- NSR, pr int 134 ms, qrs int 84 ms, qtc 447 ms.  Assessment and Plan: 1. PAF No further episodes Chadsvasc score is 0 Has cardizem 30 mg as needed for PAF  F/u in afib clinic as needed
